# Patient Record
Sex: Female | Born: 1955 | Race: White | Hispanic: No | Marital: Married | Smoking: Never smoker
Health system: Southern US, Community
[De-identification: ages and names within clinical notes are randomized; demographics above are authoritative.]

## PROBLEM LIST (undated history)

## (undated) DIAGNOSIS — M1990 Unspecified osteoarthritis, unspecified site: Secondary | ICD-10-CM

---

## 1999-02-21 ENCOUNTER — Other Ambulatory Visit: Admission: RE | Admit: 1999-02-21 | Discharge: 1999-02-21 | Payer: Self-pay | Admitting: *Deleted

## 2001-06-28 ENCOUNTER — Other Ambulatory Visit: Admission: RE | Admit: 2001-06-28 | Discharge: 2001-06-28 | Payer: Self-pay | Admitting: *Deleted

## 2001-10-02 ENCOUNTER — Ambulatory Visit (HOSPITAL_COMMUNITY): Admission: RE | Admit: 2001-10-02 | Discharge: 2001-10-02 | Payer: Self-pay | Admitting: *Deleted

## 2001-10-02 ENCOUNTER — Encounter: Payer: Self-pay | Admitting: *Deleted

## 2001-10-08 ENCOUNTER — Encounter: Admission: RE | Admit: 2001-10-08 | Discharge: 2001-10-08 | Payer: Self-pay | Admitting: *Deleted

## 2001-10-08 ENCOUNTER — Encounter: Payer: Self-pay | Admitting: *Deleted

## 2002-08-21 ENCOUNTER — Other Ambulatory Visit: Admission: RE | Admit: 2002-08-21 | Discharge: 2002-08-21 | Payer: Self-pay | Admitting: Obstetrics and Gynecology

## 2004-02-08 ENCOUNTER — Ambulatory Visit (HOSPITAL_COMMUNITY): Admission: RE | Admit: 2004-02-08 | Discharge: 2004-02-08 | Payer: Self-pay | Admitting: Obstetrics & Gynecology

## 2004-02-24 ENCOUNTER — Other Ambulatory Visit: Admission: RE | Admit: 2004-02-24 | Discharge: 2004-02-24 | Payer: Self-pay | Admitting: Obstetrics & Gynecology

## 2005-06-26 ENCOUNTER — Ambulatory Visit (HOSPITAL_COMMUNITY): Admission: RE | Admit: 2005-06-26 | Discharge: 2005-06-26 | Payer: Self-pay | Admitting: Obstetrics & Gynecology

## 2006-07-24 ENCOUNTER — Ambulatory Visit (HOSPITAL_COMMUNITY): Admission: RE | Admit: 2006-07-24 | Discharge: 2006-07-24 | Payer: Self-pay | Admitting: Obstetrics & Gynecology

## 2008-03-19 ENCOUNTER — Ambulatory Visit (HOSPITAL_COMMUNITY): Admission: RE | Admit: 2008-03-19 | Discharge: 2008-03-19 | Payer: Self-pay | Admitting: Obstetrics & Gynecology

## 2008-04-01 ENCOUNTER — Encounter: Admission: RE | Admit: 2008-04-01 | Discharge: 2008-04-01 | Payer: Self-pay | Admitting: Obstetrics & Gynecology

## 2008-09-21 ENCOUNTER — Encounter: Admission: RE | Admit: 2008-09-21 | Discharge: 2008-09-21 | Payer: Self-pay | Admitting: Obstetrics & Gynecology

## 2009-08-02 ENCOUNTER — Encounter: Admission: RE | Admit: 2009-08-02 | Discharge: 2009-08-02 | Payer: Self-pay | Admitting: Obstetrics & Gynecology

## 2011-01-02 ENCOUNTER — Other Ambulatory Visit
Admission: RE | Admit: 2011-01-02 | Discharge: 2011-01-02 | Payer: Self-pay | Source: Home / Self Care | Admitting: Family Medicine

## 2011-01-04 ENCOUNTER — Encounter
Admission: RE | Admit: 2011-01-04 | Discharge: 2011-01-04 | Payer: Self-pay | Source: Home / Self Care | Attending: Family Medicine | Admitting: Family Medicine

## 2012-07-29 ENCOUNTER — Other Ambulatory Visit: Payer: Self-pay | Admitting: Obstetrics

## 2012-07-29 DIAGNOSIS — Z1231 Encounter for screening mammogram for malignant neoplasm of breast: Secondary | ICD-10-CM

## 2012-08-15 ENCOUNTER — Ambulatory Visit
Admission: RE | Admit: 2012-08-15 | Discharge: 2012-08-15 | Disposition: A | Discharge status: Home / Self Care | Payer: BC Managed Care – PPO | Source: Ambulatory Visit | Attending: Obstetrics | Admitting: Obstetrics

## 2012-08-15 DIAGNOSIS — Z1231 Encounter for screening mammogram for malignant neoplasm of breast: Secondary | ICD-10-CM

## 2012-08-20 ENCOUNTER — Other Ambulatory Visit: Payer: Self-pay | Admitting: Obstetrics

## 2012-08-20 DIAGNOSIS — R928 Other abnormal and inconclusive findings on diagnostic imaging of breast: Secondary | ICD-10-CM

## 2012-08-23 ENCOUNTER — Ambulatory Visit
Admission: RE | Admit: 2012-08-23 | Discharge: 2012-08-23 | Disposition: A | Discharge status: Home / Self Care | Payer: BC Managed Care – PPO | Source: Ambulatory Visit | Attending: Obstetrics | Admitting: Obstetrics

## 2012-08-23 DIAGNOSIS — R928 Other abnormal and inconclusive findings on diagnostic imaging of breast: Secondary | ICD-10-CM

## 2018-04-10 ENCOUNTER — Other Ambulatory Visit: Payer: Self-pay | Admitting: Family Medicine

## 2018-04-10 DIAGNOSIS — Z139 Encounter for screening, unspecified: Secondary | ICD-10-CM

## 2018-04-30 ENCOUNTER — Ambulatory Visit
Admission: RE | Admit: 2018-04-30 | Discharge: 2018-04-30 | Disposition: A | Discharge status: Home / Self Care | Payer: BC Managed Care – PPO | Source: Ambulatory Visit | Attending: Family Medicine | Admitting: Family Medicine

## 2018-04-30 DIAGNOSIS — Z139 Encounter for screening, unspecified: Secondary | ICD-10-CM

## 2018-05-01 ENCOUNTER — Other Ambulatory Visit: Payer: Self-pay | Admitting: Family Medicine

## 2018-05-01 DIAGNOSIS — R928 Other abnormal and inconclusive findings on diagnostic imaging of breast: Secondary | ICD-10-CM

## 2018-05-03 ENCOUNTER — Ambulatory Visit
Admission: RE | Admit: 2018-05-03 | Discharge: 2018-05-03 | Disposition: A | Discharge status: Home / Self Care | Payer: BC Managed Care – PPO | Source: Ambulatory Visit | Attending: Family Medicine | Admitting: Family Medicine

## 2018-05-03 DIAGNOSIS — R928 Other abnormal and inconclusive findings on diagnostic imaging of breast: Secondary | ICD-10-CM

## 2019-08-04 ENCOUNTER — Telehealth: Payer: Self-pay

## 2019-08-04 NOTE — Telephone Encounter (Signed)
NOTES ON FILE FROM DR Marda Stalker [phone] REFERRAL SENT TO SCHEDULING

## 2019-09-04 ENCOUNTER — Encounter: Payer: Self-pay | Admitting: Cardiology

## 2019-09-04 ENCOUNTER — Other Ambulatory Visit: Payer: Self-pay

## 2019-09-04 ENCOUNTER — Ambulatory Visit: Payer: BC Managed Care – PPO | Admitting: Cardiology

## 2019-09-04 VITALS — BP 130/78 | HR 78 | Temp 97.0°F | Ht 64.0 in | Wt 199.6 lb

## 2019-09-04 DIAGNOSIS — R002 Palpitations: Secondary | ICD-10-CM | POA: Diagnosis not present

## 2019-09-04 DIAGNOSIS — Z7189 Other specified counseling: Secondary | ICD-10-CM | POA: Diagnosis not present

## 2019-09-04 NOTE — Progress Notes (Signed)
Cardiology Office Note:    Date:  09/04/2019   ID:  Virginia Fischer, DOB [birth year], MRN [id number]  PCP:  Marda Stalker, PA-C  Cardiologist:  Buford Dresser, MD PhD  Referring MD: Marda Stalker, PA-C   CC: palpitations  History of Present Illness:    Virginia Fischer is a 63 y.o. female without significant PMH who is seen as a new consult at the request of Marda Stalker, PA-C for the evaluation and management of palpitations.  Tachycardia/palpitations: -Initial onset: first noticed >15-20 years ago, but increased in 06/2019. Was walking 4 mi/3x per week without issues. Felt like her heart was skipping a beat. Stopped, but then the next day in the afternoon it came back and was worse. Felt chills/shaking at the time, better when she lay down. Felt like hard beats and skips. Settled down, has improved. Has a lot of medical anxiety per her description. Retired a few years ago, has had a hard time dealing with that, misses work. During the time her symptoms occurred, daughter/fiancee and her son all moved back home for a time. Mother has afib,  -Frequency/Duration: currently improved. Had been 1-2x/year before, events in July of this year were worst ever. -Associated symptoms: no chest pain, shortness of breath, dizziness, or presyncopal symptoms at the time. -Aggravating/alleviating factors: no clear related factors. Has improved with staying hydrated, laying on back instead of side when she sleeps, doing meditation app. Thinks it might have helped. -Syncope/near syncope: none -Prior cardiac history: none -Prior ECG:  -Prior workup: none -Prior treatment: none -Possible medication interactions: no prescription meds -Caffeine: none -Alcohol: glass of wine with dinner not every night but most nights. -Tobacco: none -OTC supplements: vit D, PRN advil, otherwise none -Comorbidities: none -Exercise level: quit walking for a while back in July, but recently restarted.  Doing a few miles/day currently without issue. -Labs: TSH, kidney function/electrolytes, CBC reviewed. -Cardiac ROS: no chest pain, no shortness of breath, no PND, no orthopnea, no LE edema. -Family history: mother has afib. Mat gpa had pacemaker and mat gma had CHF, died of MI in her late 66s. Gena Fray had stroke late in life. Older sister with HTN, younger sister healthy.  History reviewed. No pertinent past medical history.  History reviewed. No pertinent surgical history.  Current Medications: No current outpatient medications on file prior to visit.   No current facility-administered medications on file prior to visit.      Allergies:   Patient has no known allergies.   Social History   Socioeconomic History  . Marital status: Married    Spouse name: Not on file  . Number of children: Not on file  . Years of education: Not on file  . Highest education level: Not on file  Occupational History  . Not on file  Social Needs  . Financial resource strain: Not on file  . Food insecurity    Worry: Not on file    Inability: Not on file  . Transportation needs    Medical: Not on file    Non-medical: Not on file  Tobacco Use  . Smoking status: Never Smoker  . Smokeless tobacco: Never Used  Substance and Sexual Activity  . Alcohol use: Not on file  . Drug use: Not on file  . Sexual activity: Not on file  Lifestyle  . Physical activity    Days per week: Not on file    Minutes per session: Not on file  . Stress: Not on file  Relationships  . Social Musician on phone: Not on file    Gets together: Not on file    Attends religious service: Not on file    Active member of club or organization: Not on file    Attends meetings of clubs or organizations: Not on file    Relationship status: Not on file  Other Topics Concern  . Not on file  Social History Narrative  . Not on file     Family History: mother has afib. Mat gpa had pacemaker and mat gma had CHF,  died of MI in her late 75s. Audelia Hives had stroke late in life. Older sister with HTN, younger sister healthy.  ROS:   Please see the history of present illness.  Additional pertinent ROS: Constitutional: Negative for chills, fever, night sweats, unintentional weight loss  HENT: Negative for ear pain and hearing loss.   Eyes: Negative for loss of vision and eye pain.  Respiratory: Negative for cough, sputum, wheezing.   Cardiovascular: See HPI. Gastrointestinal: Negative for abdominal pain, melena, and hematochezia.  Genitourinary: Negative for dysuria and hematuria.  Musculoskeletal: Negative for falls and myalgias.  Skin: Negative for itching and rash.  Neurological: Negative for focal weakness, focal sensory changes and loss of consciousness.  Endo/Heme/Allergies: Does not bruise/bleed easily.     EKGs/Labs/Other Studies Reviewed:    The following studies were reviewed today: No prior cardiac studies  EKG:  EKG is personally reviewed.  The ekg ordered today demonstrates NSR  Recent Labs: No results found for requested labs within last 8760 hours.  Recent Lipid Panel No results found for: CHOL, TRIG, HDL, CHOLHDL, VLDL, LDLCALC, LDLDIRECT  Physical Exam:    VS:  BP 130/78   Pulse 78   Temp (!) 97 F (36.1 C)   Ht 5\' 4"  (1.626 m)   Wt 199 lb 9.6 oz (90.5 kg)   SpO2 99%   BMI 34.26 kg/m     Wt Readings from Last 3 Encounters:  09/04/19 199 lb 9.6 oz (90.5 kg)    GEN: Well nourished, well developed in no acute distress HEENT: Normal, moist mucous membranes NECK: No JVD CARDIAC: regular rhythm, normal S1 and S2, no murmurs, rubs, gallops.  VASCULAR: Radial and DP pulses 2+ bilaterally. No carotid bruits RESPIRATORY:  Clear to auscultation without rales, wheezing or rhonchi  ABDOMEN: Soft, non-tender, non-distended MUSCULOSKELETAL:  Ambulates independently SKIN: Warm and dry, no edema NEUROLOGIC:  Alert and oriented x 3. No focal neuro deficits noted. PSYCHIATRIC:   Normal affect    ASSESSMENT:    1. Palpitation   2. Cardiac risk counseling   3. Counseling on health promotion and disease prevention    PLAN:    Palpitations: infrequent, had some back to back several months ago, none since. Had gone years between episodes in the past. Discussed that we have monitors that can be worn for up to 30 days (though most comfortable if 2 weeks or less), but for infrequent events, these may not catch episodes. They can rule out silent arrhythmia, but this is not what has been bothersome to her. Also discussed alternatives, such as Building surveyor. -given the infrequency of her symptoms, declines monitor at this time -will consider [employer] -if symptoms recur with increased frequency, she will contact me, and we will order a monitor  Cardiac risk counseling and prevention recommendations: -recommend heart healthy/Mediterranean diet, with whole grains, fruits, vegetable, fish, lean meats, nuts, and olive oil. Limit salt. -recommend moderate  walking, 3-5 times/week for 30-50 minutes each session. Aim for at least 150 minutes.week. Goal should be pace of 3 miles/hours, or walking 1.5 miles in 30 minutes -recommend avoidance of tobacco products. Avoid excess alcohol. -Additional risk factor control:  -Diabetes: A1c from 2017 5.6, no history of diabeted  -Lipids: from 2019: LDL 122, HDL 59, TG 76  -Blood pressure control: no history of hypertension, at goal today  -Weight: BMI 34, would benefit from healthy weight -ASCVD risk score: The ASCVD Risk score Denman George(Goff DC Jr., et al., 2013) failed to calculate for the following reasons:   The systolic blood pressure is missing   Cannot find a previous HDL lab   Cannot find a previous total cholesterol lab   The smoking status is invalid    Plan for follow up:  Medication Adjustments/Labs and Tests Ordered: Current medicines are reviewed at length with the patient today.  Concerns regarding medicines are  outlined above.  Orders Placed This Encounter  Procedures  . EKG 12-Lead   No orders of the defined types were placed in this encounter.   Patient Instructions  Medication Instructions:  Your Physician recommend you continue on your current medication as directed.    If you need a refill on your cardiac medications before your next appointment, please call your pharmacy.   Lab work: None  Testing/Procedures: None  Follow-Up: At [facility], you and your health needs are our priority.  As part of our continuing mission to provide you with exceptional heart care, we have created designated Provider Care Teams.  These Care Teams include your primary Cardiologist (physician) and Advanced Practice Providers (APPs -  Physician Assistants and Nurse Practitioners) who all work together to provide you with the care you need, when you need it. You will need a follow up appointment as needed.  Please call our office 2 months in advance to schedule this appointment.  You may see Dr. Cristal Deerhristopher or one of the following Advanced Practice Providers on your designated Care Team:   Theodore DemarkRhonda Barrett, PA-C . Joni ReiningKathryn Lawrence, DNP, ANP  [facility]       Signed, Jodelle RedBridgette Athan Casalino, MD PhD 09/04/2019 9:52 PM    [facility]

## 2019-09-04 NOTE — Patient Instructions (Signed)
Medication Instructions:  Your Physician recommend you continue on your current medication as directed.    If you need a refill on your cardiac medications before your next appointment, please call your pharmacy.   Lab work: None  Testing/Procedures: None  Follow-Up: At [facility], you and your health needs are our priority.  As part of our continuing mission to provide you with exceptional heart care, we have created designated Provider Care Teams.  These Care Teams include your primary Cardiologist (physician) and Advanced Practice Providers (APPs -  Physician Assistants and Nurse Practitioners) who all work together to provide you with the care you need, when you need it. You will need a follow up appointment as needed.  Please call our office 2 months in advance to schedule this appointment.  You may see Dr. Harrell Gave or one of the following Advanced Practice Providers on your designated Care Team:   Rosaria Ferries, PA-C . Jory Sims, DNP, ANP  [facility]ton

## 2019-09-08 ENCOUNTER — Encounter: Payer: Self-pay | Admitting: Cardiology

## 2019-09-29 ENCOUNTER — Other Ambulatory Visit: Payer: Self-pay | Admitting: Family Medicine

## 2019-09-29 DIAGNOSIS — Z1231 Encounter for screening mammogram for malignant neoplasm of breast: Secondary | ICD-10-CM

## 2019-10-03 ENCOUNTER — Ambulatory Visit
Admission: RE | Admit: 2019-10-03 | Discharge: 2019-10-03 | Disposition: A | Discharge status: Home / Self Care | Payer: BC Managed Care – PPO | Source: Ambulatory Visit | Attending: Family Medicine | Admitting: Family Medicine

## 2019-10-03 ENCOUNTER — Other Ambulatory Visit: Payer: Self-pay

## 2019-10-03 DIAGNOSIS — Z1231 Encounter for screening mammogram for malignant neoplasm of breast: Secondary | ICD-10-CM

## 2019-10-07 ENCOUNTER — Other Ambulatory Visit: Payer: Self-pay | Admitting: Family Medicine

## 2019-10-07 DIAGNOSIS — R928 Other abnormal and inconclusive findings on diagnostic imaging of breast: Secondary | ICD-10-CM

## 2019-10-08 ENCOUNTER — Ambulatory Visit
Admission: RE | Admit: 2019-10-08 | Discharge: 2019-10-08 | Disposition: A | Discharge status: Home / Self Care | Payer: BC Managed Care – PPO | Source: Ambulatory Visit | Attending: Family Medicine | Admitting: Family Medicine

## 2019-10-08 ENCOUNTER — Other Ambulatory Visit: Payer: Self-pay

## 2019-10-08 DIAGNOSIS — R928 Other abnormal and inconclusive findings on diagnostic imaging of breast: Secondary | ICD-10-CM

## 2019-10-22 ENCOUNTER — Other Ambulatory Visit (HOSPITAL_COMMUNITY)
Admission: RE | Admit: 2019-10-22 | Discharge: 2019-10-22 | Disposition: A | Discharge status: Home / Self Care | Payer: BC Managed Care – PPO | Source: Ambulatory Visit | Attending: Family Medicine | Admitting: Family Medicine

## 2019-10-22 ENCOUNTER — Other Ambulatory Visit: Payer: Self-pay | Admitting: Family Medicine

## 2019-10-22 DIAGNOSIS — Z124 Encounter for screening for malignant neoplasm of cervix: Secondary | ICD-10-CM | POA: Diagnosis present

## 2019-10-27 LAB — CYTOLOGY - PAP
Comment: NEGATIVE
Diagnosis: NEGATIVE
High risk HPV: NEGATIVE

## 2020-01-26 IMAGING — MG DIGITAL DIAGNOSTIC UNILAT LEFT W/ CAD
3 series · 3 of 3 positions shown · non-contrast
Comparison: Previous exam(s).

CLINICAL DATA: Patient recalled from screening for left breast
calcifications.

EXAM:
DIGITAL DIAGNOSTIC LEFT MAMMOGRAM WITH CAD

[L ML (1 of 2)]
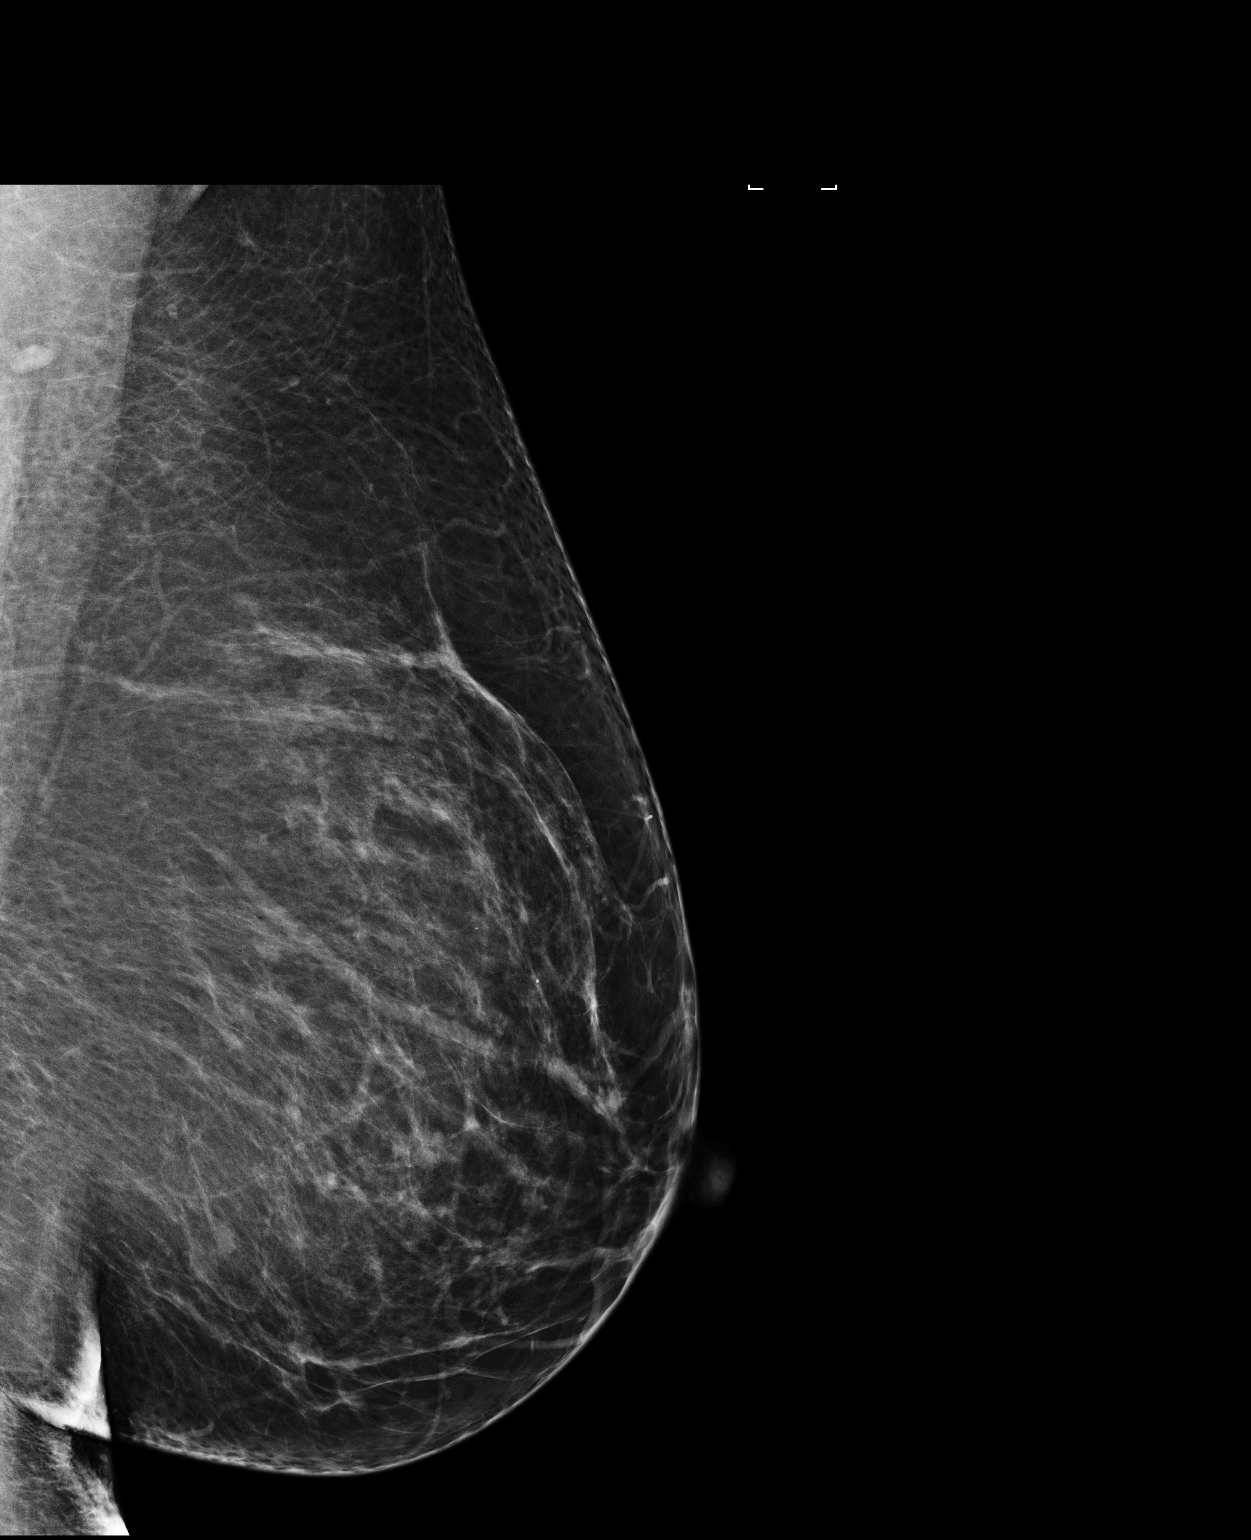

[L CC]
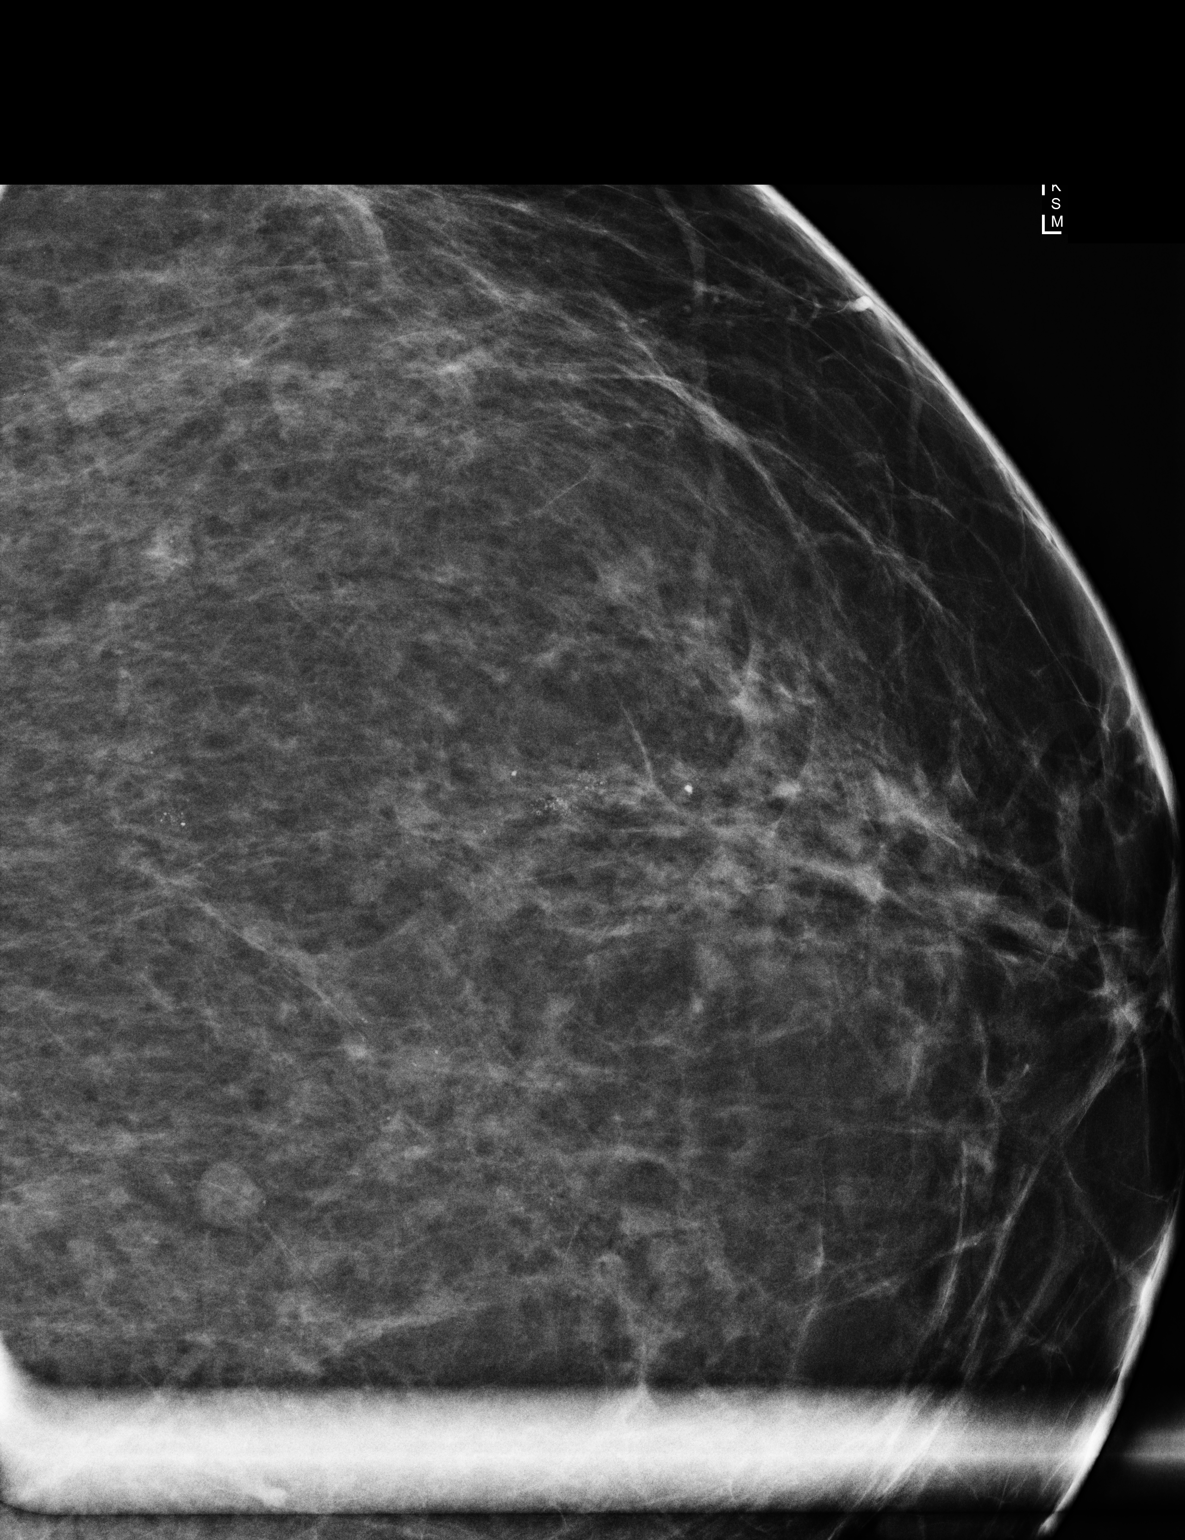

[L ML (2 of 2)]
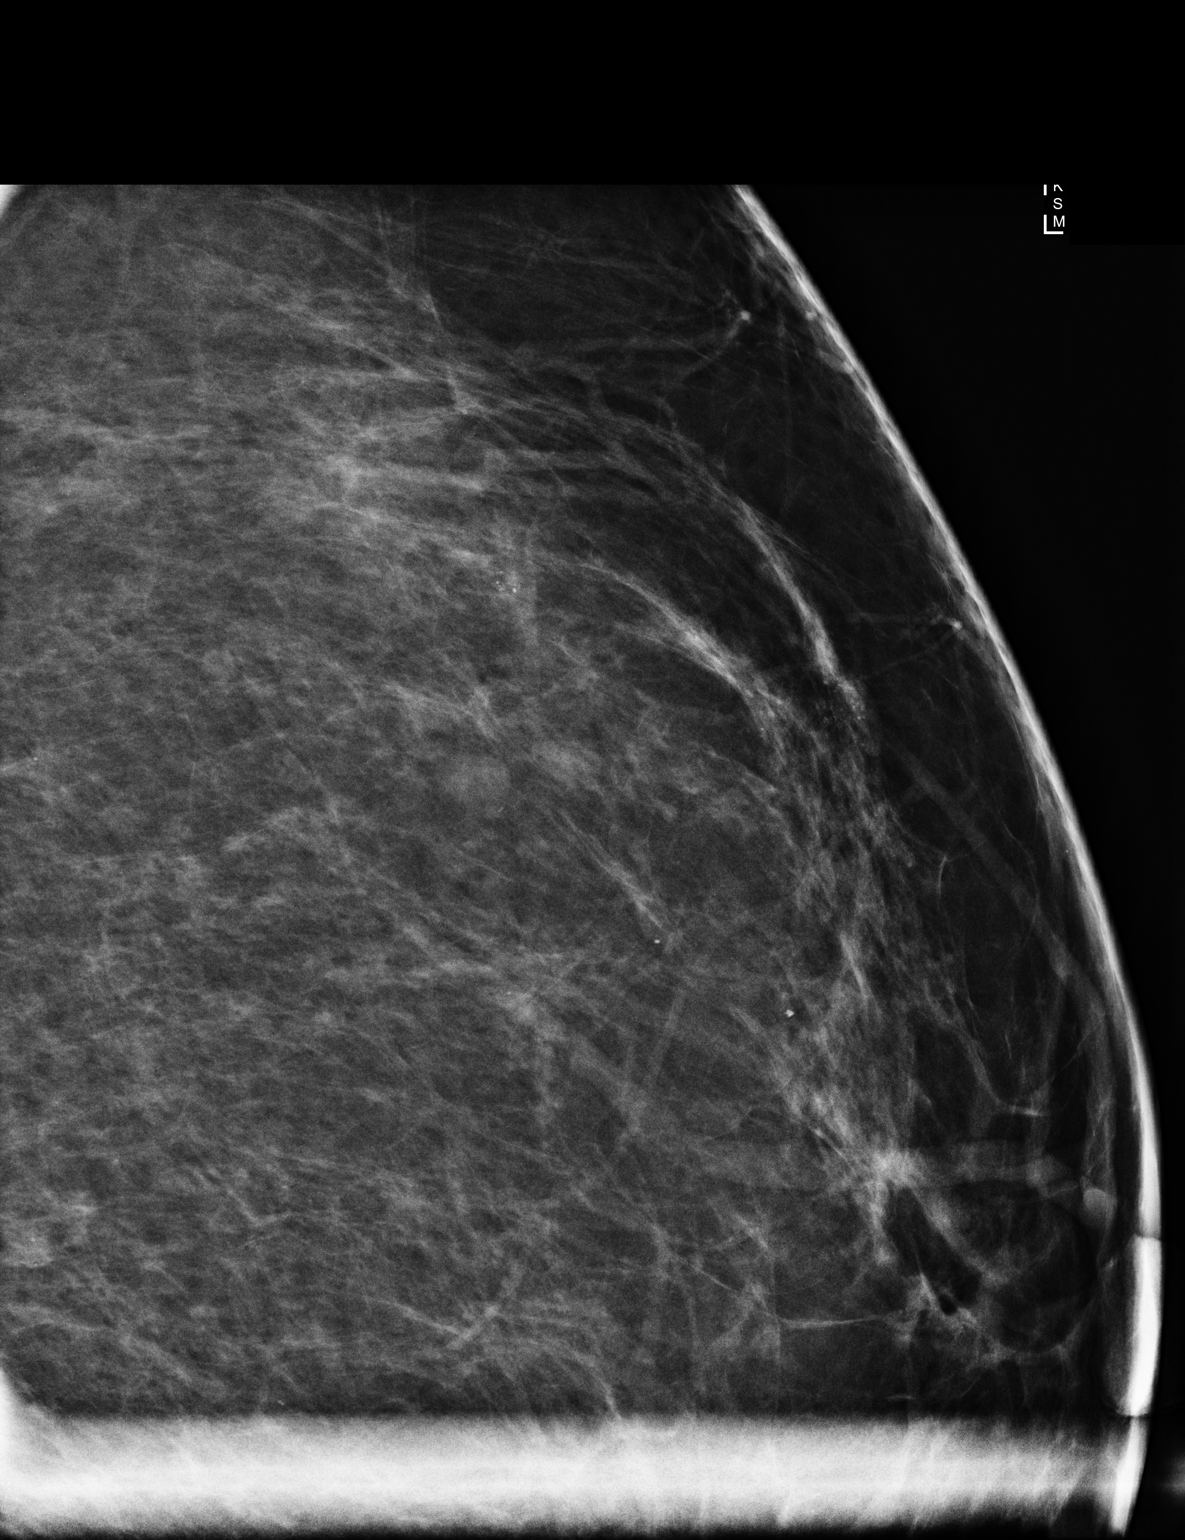

[3 of 3 positions shown; findings below may reference images not displayed]

ACR Breast Density Category c: The breast tissue is heterogeneously
dense, which may obscure small masses.
FINDINGS: Magnification CC and true lateral views of the left breast were
obtained. Loosely grouped round and punctate calcifications are
demonstrated within the superior aspect of the left breast. Overall
these appear stable compared with multiple prior exams.

Mammographic images were processed with CAD.
IMPRESSION: Benign left breast calcifications.

RECOMMENDATION:
Screening mammogram in one year.(Code:[id number])

I have discussed the findings and recommendations with the patient.
If applicable, a reminder letter will be sent to the patient
regarding the next appointment.

BI-RADS CATEGORY  2: Benign.

## 2020-03-05 ENCOUNTER — Ambulatory Visit: Payer: BC Managed Care – PPO | Attending: Internal Medicine

## 2020-03-05 DIAGNOSIS — Z23 Encounter for immunization: Secondary | ICD-10-CM

## 2020-03-05 NOTE — Progress Notes (Signed)
   Covid-19 Vaccination Clinic  Name:  Virginia Fischer    MRN: [id number] DOB: 02-02-1956  03/05/2020  [name] was observed post Covid-19 immunization for 15 minutes without incident. She was provided with Vaccine Information Sheet and instruction to access the V-Safe system.   [name] was instructed to call 911 with any severe reactions post vaccine: Marland Kitchen Difficulty breathing  . Swelling of face and throat  . A fast heartbeat  . A bad rash all over body  . Dizziness and weakness   Immunizations Administered    Name Date Dose VIS Date Route   Pfizer COVID-19 Vaccine 03/05/2020  9:10 AM 0.3 mL 11/28/2019 Intramuscular   Manufacturer: [employer], [employer]   Lot: [id number]   NDC: [address]-0

## 2020-03-30 ENCOUNTER — Ambulatory Visit: Payer: BC Managed Care – PPO | Attending: Internal Medicine

## 2020-03-30 DIAGNOSIS — Z23 Encounter for immunization: Secondary | ICD-10-CM

## 2020-03-30 NOTE — Progress Notes (Signed)
   Covid-19 Vaccination Clinic  Name:  Virginia Fischer    MRN: [id number] DOB: 05-07-1956  03/30/2020  Virginia Fischer was observed post Covid-19 immunization for 15 minutes without incident. She was provided with Vaccine Information Sheet and instruction to access the V-Safe system.   Virginia Fischer was instructed to call 911 with any severe reactions post vaccine: Marland Kitchen Difficulty breathing  . Swelling of face and throat  . A fast heartbeat  . A bad rash all over body  . Dizziness and weakness   Immunizations Administered    Name Date Dose VIS Date Route   Pfizer COVID-19 Vaccine 03/30/2020 10:03 AM 0.3 mL 11/28/2019 Intramuscular   Manufacturer: [employer], [employer]   Lot: [id number]   NDC: [address]-2

## 2020-09-21 ENCOUNTER — Other Ambulatory Visit: Payer: Self-pay | Admitting: Family Medicine

## 2020-09-21 DIAGNOSIS — Z1231 Encounter for screening mammogram for malignant neoplasm of breast: Secondary | ICD-10-CM

## 2020-10-08 ENCOUNTER — Ambulatory Visit
Admission: RE | Admit: 2020-10-08 | Discharge: 2020-10-08 | Disposition: A | Discharge status: Home / Self Care | Payer: BC Managed Care – PPO | Source: Ambulatory Visit | Attending: Family Medicine | Admitting: Family Medicine

## 2020-10-08 ENCOUNTER — Other Ambulatory Visit: Payer: Self-pay

## 2020-10-08 DIAGNOSIS — Z1231 Encounter for screening mammogram for malignant neoplasm of breast: Secondary | ICD-10-CM

## 2020-10-13 ENCOUNTER — Other Ambulatory Visit: Payer: Self-pay | Admitting: Family Medicine

## 2020-10-13 DIAGNOSIS — R928 Other abnormal and inconclusive findings on diagnostic imaging of breast: Secondary | ICD-10-CM

## 2020-10-29 ENCOUNTER — Ambulatory Visit
Admission: RE | Admit: 2020-10-29 | Discharge: 2020-10-29 | Disposition: A | Discharge status: Home / Self Care | Payer: BC Managed Care – PPO | Source: Ambulatory Visit | Attending: Family Medicine | Admitting: Family Medicine

## 2020-10-29 ENCOUNTER — Other Ambulatory Visit: Payer: Self-pay

## 2020-10-29 DIAGNOSIS — R928 Other abnormal and inconclusive findings on diagnostic imaging of breast: Secondary | ICD-10-CM

## 2021-06-17 ENCOUNTER — Other Ambulatory Visit: Payer: Self-pay | Admitting: Home Modifications

## 2021-06-17 ENCOUNTER — Ambulatory Visit
Admission: RE | Admit: 2021-06-17 | Discharge: 2021-06-17 | Disposition: A | Discharge status: Home / Self Care | Payer: BC Managed Care – PPO | Source: Ambulatory Visit | Attending: Home Modifications | Admitting: Home Modifications

## 2021-06-17 DIAGNOSIS — R52 Pain, unspecified: Secondary | ICD-10-CM

## 2021-07-11 ENCOUNTER — Encounter: Payer: Self-pay | Admitting: Orthopaedic Surgery

## 2021-07-11 ENCOUNTER — Other Ambulatory Visit: Payer: Self-pay

## 2021-07-11 ENCOUNTER — Ambulatory Visit: Payer: BC Managed Care – PPO | Admitting: Orthopaedic Surgery

## 2021-07-11 DIAGNOSIS — G8929 Other chronic pain: Secondary | ICD-10-CM | POA: Diagnosis not present

## 2021-07-11 DIAGNOSIS — M25561 Pain in right knee: Secondary | ICD-10-CM | POA: Diagnosis not present

## 2021-07-11 MED ORDER — LIDOCAINE HCL 1 % IJ SOLN
3.0000 mL | INTRAMUSCULAR | Status: AC | PRN
  Administered 2021-07-11: 3 mL

## 2021-07-11 MED ORDER — METHYLPREDNISOLONE ACETATE 40 MG/ML IJ SUSP
40.0000 mg | INTRAMUSCULAR | Status: AC | PRN
  Administered 2021-07-11: 40 mg via INTRA_ARTICULAR

## 2021-07-11 NOTE — Progress Notes (Signed)
Office Visit Note   Patient: Virginia Fischer           Date of Birth: 1956/08/27           MRN: [id number] Visit Date: 07/11/2021              Requested by: Jarrett Soho, PA-C [address] [place],  [place] [address] PCP: Jarrett Soho, PA-C   Assessment & Plan: Visit Diagnoses:  1. Chronic pain of right knee     Plan: I talked to her about her knee in general and in detail about some arthritic changes in her knee.  Certainly she could have some meniscal issues as well given the medial joint line tenderness.  I recommended a steroid injection today and I would like to set her up for outpatient physical therapy to strengthen the muscles around the knee as well as work on her IT band in her right hip.  She agrees with this treatment plan.  I explained the risks and benefits of steroid injections and she tolerated well.  Later on she may be a candidate for hyaluronic acid but working to try this first in terms of the steroid injection, continued Advil as needed and outpatient physical therapy for her right knee.  I will see her back in 4 weeks to see how she is doing overall.  All questions and concerns were answered and addressed.  Follow-Up Instructions: Return in about 4 weeks (around 08/08/2021).   Orders:  Orders Placed This Encounter  Procedures   Large Joint Inj   No orders of the defined types were placed in this encounter.     Procedures: Large Joint Inj: R knee on 07/11/2021 5:20 PM Indications: diagnostic evaluation and pain Details: 22 G 1.5 in needle, superolateral approach  Arthrogram: No  Medications: 3 mL lidocaine 1 %; 40 mg methylPREDNISolone acetate 40 MG/ML Outcome: tolerated well, no immediate complications Procedure, treatment alternatives, risks and benefits explained, specific risks discussed. Consent was given by the patient. Immediately prior to procedure a time out was called to verify the correct patient, procedure, equipment, support  staff and site/side marked as required. Patient was prepped and draped in the usual sterile fashion.      Clinical Data: No additional findings.   Subjective: Chief Complaint  Patient presents with   Right Knee - Pain  The patient is a very pleasant and active 65 year old female who comes in for evaluation treatment of chronic right knee pain.  She says it hurts more in the back of her knee but some on the medial aspect.  There is been no known injury.  She is to work out on a treadmill more and do a lot of walking.  She has done that less with time and has been developing some swelling in the back of her knee.  She says it hurts to walk or fully flex her knee back.  Taking Advil has helped.  She is never had an injection in her knee and never had surgery on that right knee.  Her left knee has no issues.  She is also been dealing with some right hip pain.  They are x-rays on the canopy system of her pelvis and hip as well as right knee.  HPI  Review of Systems   Objective: Vital Signs: There were no vitals taken for this visit.  Physical Exam She is alert and orient x3 and in no acute distress Ortho Exam Examination of both hips show they  move smoothly and fluidly.  There is a little bit of pain over the right trochanteric area and IT band on the right side.  Her right knee shows varus malalignment that is only slight but easily correctable.  Both knees have some slight patellofemoral crepitation.  The right knee does have medial joint line tenderness and pain past 90 degrees of flexion.  The right knee is ligamentously stable. Specialty Comments:  No specialty comments available.  Imaging: No results found. 2 views on the right knee on the canopy system independently reviewed show mild to moderate arthritic changes mainly involving the patellofemoral joint.  There is slight varus malalignment and small osteophytes in all 3 compartments.  X-rays of the pelvis and right hip are  negative with a normal-appearing hip joint.  PMFS History: There are no problems to display for this patient.  History reviewed. No pertinent past medical history.  Family History  Problem Relation Age of Onset   Hypertension Mother    Arrhythmia Mother    Hypertension Sister    Heart failure Maternal Grandmother    Cancer Maternal Grandfather    Cancer Paternal Grandmother    Stroke Paternal Grandfather    Breast cancer Neg Hx     History reviewed. No pertinent surgical history. Social History   Occupational History   Not on file  Tobacco Use   Smoking status: Never   Smokeless tobacco: Never  Substance and Sexual Activity   Alcohol use: Not on file   Drug use: Not on file   Sexual activity: Not on file

## 2021-07-13 ENCOUNTER — Other Ambulatory Visit: Payer: Self-pay

## 2021-07-13 DIAGNOSIS — G8929 Other chronic pain: Secondary | ICD-10-CM

## 2021-07-21 ENCOUNTER — Other Ambulatory Visit: Payer: Self-pay

## 2021-07-21 ENCOUNTER — Ambulatory Visit: Payer: BC Managed Care – PPO | Admitting: Physical Therapy

## 2021-07-21 DIAGNOSIS — G8929 Other chronic pain: Secondary | ICD-10-CM | POA: Diagnosis not present

## 2021-07-21 DIAGNOSIS — M25561 Pain in right knee: Secondary | ICD-10-CM | POA: Diagnosis not present

## 2021-07-21 DIAGNOSIS — R262 Difficulty in walking, not elsewhere classified: Secondary | ICD-10-CM | POA: Diagnosis not present

## 2021-07-21 DIAGNOSIS — M25551 Pain in right hip: Secondary | ICD-10-CM

## 2021-07-21 NOTE — Patient Instructions (Signed)
Access Code: [email or web address] URL: [email or web address] Date: 07/21/2021 Prepared by: Ivery Quale  Exercises Supine Quadriceps Stretch with Strap on Table - 2 x daily - 6 x weekly - 2-3 reps - 30 hold Supine ITB Stretch with Strap - 2 x daily - 6 x weekly - 2-3 reps - 30 hold Figure 4 Bridge - 2 x daily - 6 x weekly - 1-2 sets - 10 reps Standing Hip Flexion with Resistance Loop - 2 x daily - 6 x weekly - 2 sets - 15-20 reps Standing Hip Abduction with Resistance at Ankles and Counter Support - 2 x daily - 6 x weekly - 2 sets - 10 reps Standing Hip Extension Kicks - 2 x daily - 6 x weekly - 2 sets - 10 reps Sit to Stand Without Arm Support - 2 x daily - 6 x weekly - 1-2 sets - 10 reps

## 2021-07-22 NOTE — Therapy (Addendum)
[facility] [address] [place], [place], [address] Phone: [phone]   Fax:  [phone]  Physical Therapy Evaluation /Discharge  Patient Details  Name: Virginia Fischer MRN: [id number] Date of Birth: 18-Dec-1956 Referring Provider (PT): Mcarthur Rossetti, MD   Encounter Date: 07/21/2021   PT End of Session - 07/22/21 0800     Visit Number 1    Number of Visits 4    Date for PT Re-Evaluation 09/16/21    PT Start Time 1600    PT Stop Time 1650    PT Time Calculation (min) 50 min    Activity Tolerance Patient tolerated treatment well    Behavior During Therapy [facility] for tasks assessed/performed             No past medical history on file.  No past surgical history on file.  There were no vitals filed for this visit.    Subjective Assessment - 07/21/21 1602     Subjective Chronic pain of right knee for last 2 years.without known MOI  she had injection Rt knee 07/11/21 which did help a lot. 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    Diagnostic tests Imaging " XR showing moderate arthritic changes mainly involving the patellofemoral joint.  There is slight varus malalignment and small osteophytes in all 3 compartments.  X-rays of the pelvis and right hip are negative with a normal-appearing hip joint"    Currently in Pain? Yes    Pain Score --   no pain currently because had the week off from volunteering, it can get up to 7/10   Pain Location Other (Comment)   Rt hip and knee   Pain Orientation Right    Pain Descriptors / Indicators Aching    Pain Type Chronic pain    Pain Radiating Towards maybe N/T down Rt thigh.    Aggravating Factors  prolonged standing, walking, stairs    Pain Relieving Factors laying down on back, ice, rest, injection                [facility] PT Assessment - 07/22/21 0001       Assessment   Medical Diagnosis  M25.561,G89.29 (ICD-10-CM) - Chronic pain of right knee    Referring Provider (PT) Mcarthur Rossetti, MD    Onset Date/Surgical Date --   2 year onset of pai   Next MD Visit 08/08/21      Balance Screen   Has the patient fallen in the past 6 months No    Has the patient had a decrease in activity level because of a fear of falling?  No    Is the patient reluctant to leave their home because of a fear of falling?  No      Home Ecologist residence      Prior Function   Level of Independence Independent      Cognition   Overall Cognitive Status Within Functional Limits for tasks assessed      Posture/Postural Control   Posture Comments Lt lateral trunk shift?      ROM / Strength   AROM / PROM / Strength AROM;Strength      AROM   Overall AROM Comments WFL for hip, knee, lumbarr      Strength   Overall Strength Comments WFL gross LE strength bilat      Flexibility  Soft Tissue Assessment /Muscle Length --   mildly tight hamstrings and hip flexors     Palpation   Palpation comment denies TTP      Special Tests   Other special tests negative FABERS test, negative lumbar test      Ambulation/Gait   Gait Comments WFL                        Objective measurements completed on examination: See above findings.               PT Education - 07/22/21 0800     Education Details HEP,POC    Person(s) Educated Patient    Methods Explanation;Demonstration;Verbal cues;Handout    Comprehension Verbalized understanding                         Plan - 07/22/21 0801     Clinical Impression Statement She presents with chronic Rt hip and knee pain that has improved greatly after injection. 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 We discussed graded activity progression and gentle return to walking program. She  has good overall strength and ROM and special testing was negative today. At this point I provided her with HEP and feel like she will not need much PT and she also has high co pay. She will trial HEP, follow back up with MD at the end of the month and then return to PT if needed.    Examination-Activity Limitations Stairs;Stand    Examination-Participation Restrictions Occupation    Stability/Clinical Decision Making Stable/Uncomplicated    Clinical Decision Making Low    Rehab Potential Good    PT Frequency 1x / week    PT Duration 8 weeks    PT Treatment/Interventions ADLs/Self Care Home Management;Cryotherapy;Electrical Stimulation;Iontophoresis 20m/ml Dexamethasone;Ultrasound;Gait training;Therapeutic activities;Therapeutic exercise;Neuromuscular re-education;Manual techniques;Passive range of motion;Dry needling;Joint Manipulations;Vasopneumatic Device;Taping    PT Next Visit Plan establish long term goals if she returns, if not close out after POC date is up    PT Home Exercise Plan Access Code: [email or web address]            Patient will benefit from skilled therapeutic intervention in order to improve the following deficits and impairments:  Decreased activity tolerance, Difficulty walking, Postural dysfunction  Visit Diagnosis: Chronic pain of right knee  Pain in right hip  Difficulty in walking, not elsewhere classified     Problem List There are no problems to display for this patient.   BMarrianne MoodNelson,PT,DPT 07/22/2021, 8:07 AM  PHYSICAL THERAPY DISCHARGE SUMMARY  Visits from Start of Care: 1  Current functional level related to goals / functional outcomes: See note   Remaining deficits: See note   Education / Equipment: HEP   Patient agrees to discharge. Patient goals were not met. Patient is being discharged due to not returning since the last visit. MScot Jun PT, DPT, OCS, ATC 09/28/21  9:13 AM     [facility] [address] [place] [address]Phone: [phone]  Fax:  [phone] Name: CTHAYER INABINETMRN: [id number] of Birth: 110-31-1957

## 2021-08-08 ENCOUNTER — Ambulatory Visit: Payer: BC Managed Care – PPO | Admitting: Orthopaedic Surgery

## 2021-08-08 ENCOUNTER — Other Ambulatory Visit: Payer: Self-pay

## 2021-08-08 DIAGNOSIS — M25561 Pain in right knee: Secondary | ICD-10-CM

## 2021-08-08 DIAGNOSIS — G8929 Other chronic pain: Secondary | ICD-10-CM | POA: Diagnosis not present

## 2021-08-08 NOTE — Progress Notes (Signed)
The patient is a 65 year old well-known to me.  She has mild to moderate arthritis in her right knee and we placed a steroid injection on July 25 and that knee for the first time she has had 1.  She said that is worked great as well as going to physical therapy that is also helped work on Dance movement psychotherapist.  She is dealing with right groin pain and she does not feel like it is a hip issue.  I have actually x-rayed her right hip and pelvis and her x-rays are normal of the right hip joint itself.  On exam I can easily put her right hip through internal and external rotation and her pain seems to be just above the hip joint more in the groin area.  She is very tender in this area.  She does not have a GYN physician because she has her primary care physician take care of those issues.  She is concerned about the pain that she still gets in the groin area and it does not appear to be mechanical in nature from my standpoint.  At this point I would like to send her to [facility] surgery for evaluation of her right groin area to help determine whether or not this is a hernia or needs further work-up.  We will work on making that referral.  As far as her knee goes, she will continue quad strengthening exercises and if things worsen in any way she can always get repeat injections.  All questions and concerns were answered and addressed.

## 2021-08-09 ENCOUNTER — Other Ambulatory Visit: Payer: Self-pay

## 2021-08-09 DIAGNOSIS — R1031 Right lower quadrant pain: Secondary | ICD-10-CM

## 2021-08-29 ENCOUNTER — Telehealth: Payer: Self-pay | Admitting: Orthopaedic Surgery

## 2021-08-29 ENCOUNTER — Telehealth: Payer: Self-pay | Admitting: Cardiology

## 2021-08-29 DIAGNOSIS — R002 Palpitations: Secondary | ICD-10-CM

## 2021-08-29 NOTE — Telephone Encounter (Signed)
The referral was put in back in august. Can you please check on this for me?

## 2021-08-29 NOTE — Telephone Encounter (Signed)
Pt called wanting to check on a referral Dr. Magnus Ivan was supposed to send in for her. Pt would like a CB to discuss the hold up please.   [phone]

## 2021-08-29 NOTE — Telephone Encounter (Signed)
Patient c/o Palpitations:  High priority if patient c/o lightheadedness, shortness of breath, or chest pain  How long have you had palpitations/irregular HR/ Afib? Are you having the symptoms now? A month ago, no Are you currently experiencing lightheadedness, SOB or CP? no  Do you have a history of afib (atrial fibrillation) or irregular heart rhythm? yes  Have you checked your BP or HR? (document readings if available): HR usually 80's   Are you experiencing any other symptoms? During palpitations states she feels hot and cold, then exhausted   Patient states she has started to get palpitations again. She says it started about a month ago, but is not every day. She says she will be fine for a couple days then she will get palpitations and then she will be fine a couple more days. She says she has not had symptoms this morning, but yesterday they were bad. She says it feels like her heart skips around. She says this time if feels different.

## 2021-08-29 NOTE — Telephone Encounter (Signed)
Spoke with the patient who states that her palpitations have returned. She states that they have been occurring intermittently for about a month now. She states that they feel the same as previously. She was last seen in 08/2019 for palpitations and she states they feel the same as they did at that time. She denies any SOB or dizziness. States that she can feel heart skipping and jumping around. Heart rate has been 75-80. She states that she usually feels really hot or really cold when they begin and then she becomes fatigued. She was previously not interested in a heart monitor but now she is concerned and would like to know what is going on.

## 2021-08-29 NOTE — Telephone Encounter (Signed)
Patient has appointment 11/9 with Dr Cristal Deer, will forward for review

## 2021-08-30 NOTE — Telephone Encounter (Signed)
I called and sw receptionist Misty Stanley there and she stated it normally takes 2-3 for referral to be processed then contact pt, she transferred me to referrals and l left message to return myc all on status to see where we stand.

## 2021-09-01 NOTE — Telephone Encounter (Signed)
Patient is following up. She states she has been waiting for a call back to discuss Dr. Di Kindle recommendation.

## 2021-09-02 ENCOUNTER — Ambulatory Visit (INDEPENDENT_AMBULATORY_CARE_PROVIDER_SITE_OTHER): Payer: BC Managed Care – PPO

## 2021-09-02 DIAGNOSIS — R002 Palpitations: Secondary | ICD-10-CM

## 2021-09-02 NOTE — Progress Notes (Unsigned)
Enrolled patient for a 14 day Zio XT  monitor to be mailed to patients home  °

## 2021-09-02 NOTE — Telephone Encounter (Signed)
If there are red flags, like loss of consciousness, then I would present urgently to ER for evaluation. She can look into a device like a KardiaMobile to try to catch these events. If they are only happening sporadically, it is unlikely we will catch them on a monitor. If they become more of an every day event, please have her contact us and we can send a monitor at that time. If she can get tracings of some of these events, we can review at her appt.

## 2021-09-02 NOTE — Telephone Encounter (Signed)
Pt made aware of MD's recommendations and state symptoms are happening daily.  2 week monitor ordered Pt verbalize understanding.

## 2021-09-02 NOTE — Telephone Encounter (Signed)
Thank you :)

## 2021-09-06 DIAGNOSIS — R002 Palpitations: Secondary | ICD-10-CM | POA: Diagnosis not present

## 2021-10-26 ENCOUNTER — Encounter (HOSPITAL_BASED_OUTPATIENT_CLINIC_OR_DEPARTMENT_OTHER): Payer: Self-pay | Admitting: Cardiology

## 2021-10-26 ENCOUNTER — Other Ambulatory Visit: Payer: Self-pay

## 2021-10-26 ENCOUNTER — Ambulatory Visit (HOSPITAL_BASED_OUTPATIENT_CLINIC_OR_DEPARTMENT_OTHER): Payer: BC Managed Care – PPO | Admitting: Cardiology

## 2021-10-26 VITALS — BP 132/80 | HR 86 | Ht 64.0 in | Wt 203.9 lb

## 2021-10-26 DIAGNOSIS — Z7189 Other specified counseling: Secondary | ICD-10-CM | POA: Diagnosis not present

## 2021-10-26 DIAGNOSIS — Z712 Person consulting for explanation of examination or test findings: Secondary | ICD-10-CM | POA: Diagnosis not present

## 2021-10-26 DIAGNOSIS — R002 Palpitations: Secondary | ICD-10-CM | POA: Diagnosis not present

## 2021-10-26 NOTE — Progress Notes (Signed)
Cardiology Office Note:    Date:  10/26/2021   ID:  Sherri Rad, DOB 1956/10/23, MRN [id number]  PCP:  Marda Stalker, PA-C  Cardiologist:  Buford Dresser, MD PhD  Referring MD: Marda Stalker, PA-C   CC: follow up  History of Present Illness:    Virginia Fischer is a 65 y.o. female without significant PMH who is seen for follow-up. I initially met her 09/04/2019 as a new consult at the request of Marda Stalker, PA-C for the evaluation and management of palpitations.  Tachycardia/palpitations: -Initial onset: first noticed >15-20 years ago, but increased in 06/2019. Was walking 4 mi/3x per week without issues. Felt like her heart was skipping a beat. Stopped, but then the next day in the afternoon it came back and was worse. Felt chills/shaking at the time, better when she lay down. Felt like hard beats and skips. Settled down, has improved. Has a lot of medical anxiety per her description. Retired a few years ago, has had a hard time dealing with that, misses work. During the time her symptoms occurred, daughter/fiancee and her son all moved back home for a time. Mother has afib,  -Frequency/Duration: currently improved. Had been 1-2x/year before, events in July of this year were worst ever. -Associated symptoms: no chest pain, shortness of breath, dizziness, or presyncopal symptoms at the time. -Aggravating/alleviating factors: no clear related factors. Has improved with staying hydrated, laying on back instead of side when she sleeps, doing meditation app. Thinks it might have helped. -Family history: mother has afib. Mat gpa had pacemaker and mat gma had CHF, died of MI in her late 41s. Gena Fray had stroke late in life. Older sister with HTN, younger sister healthy.  Today: Overall, she believes she is feeling better than she was prior. However, she had a recurring event similar to the event 2 years ago. Her most recent event of palpitations lasted for about 5 weeks,  with 5 or 6 days of severe palpitations.  We reviewed her monitor results in detail which were reassuring. After discussion today, she believes her palpitations may be due to her anxiety.  She denies any chest pain, or shortness of breath. No lightheadedness, headaches, syncope, orthopnea, PND, lower extremity edema or exertional symptoms.  History reviewed. No pertinent past medical history.  History reviewed. No pertinent surgical history.  Current Medications: No current outpatient medications on file prior to visit.   No current facility-administered medications on file prior to visit.     Allergies:   Codeine   Social History   Tobacco Use   Smoking status: Never   Smokeless tobacco: Never      Family History: mother has afib. Mat gpa had pacemaker and mat gma had CHF, died of MI in her late 63s. Gena Fray had stroke late in life. Older sister with HTN, younger sister healthy.  ROS:   Please see the history of present illness. (+) Palpitations (+) Anxiety All other systems are reviewed and negative.    EKGs/Labs/Other Studies Reviewed:    The following studies were reviewed today:  Monitor 08/2021-09/2021: ~14 days of data recorded on Zio monitor. Patient had a min HR of 52 bpm, max HR of 182 bpm, and avg HR of 74 bpm. Predominant underlying rhythm was Sinus Rhythm. 4 Supraventricular Tachycardia runs occurred, the run with the fastest interval lasting 9 beats with a max rate of 182 bpm (avg 132 bpm); the run with the fastest interval was also the longest. No VT, atrial  fibrillation, high degree block, or pauses noted. Isolated atrial and ventricular ectopy was rare (<1%). There were 2 triggered events, one sinus and one sinus with PACs. No high risk arrhythmias noted.  EKG:  EKG is personally reviewed.   10/26/2021: NSR at 86 bpm 09/04/2019: NSR  Recent Labs: No results found for requested labs within last 8760 hours.   Recent Lipid Panel No results found for:  CHOL, TRIG, HDL, CHOLHDL, VLDL, LDLCALC, LDLDIRECT  Physical Exam:    VS:  BP 132/80 (BP Location: Right Arm, Patient Position: Sitting, Cuff Size: Normal)   Pulse 86   Ht _0  (1.626 m)   Wt 203 lb 14.4 oz (92.5 kg)   SpO2 99%   BMI 35.00 kg/m     Wt Readings from Last 3 Encounters:  10/26/21 203 lb 14.4 oz (92.5 kg)  09/04/19 199 lb 9.6 oz (90.5 kg)    GEN: Well nourished, well developed in no acute distress HEENT: Normal, moist mucous membranes NECK: No JVD CARDIAC: regular rhythm, normal S1 and S2, no murmurs, rubs, gallops.  VASCULAR: Radial and DP pulses 2+ bilaterally. No carotid bruits RESPIRATORY:  Clear to auscultation without rales, wheezing or rhonchi  ABDOMEN: Soft, non-tender, non-distended MUSCULOSKELETAL:  Ambulates independently SKIN: Warm and dry, no edema NEUROLOGIC:  Alert and oriented x 3. No focal neuro deficits noted. PSYCHIATRIC:  Normal affect    ASSESSMENT:    1. Heart palpitations   2. Encounter to discuss test results   3. Cardiac risk counseling   4. Counseling on health promotion and disease prevention     PLAN:    Palpitations:  -reviewed monitor today, very reassuring. She captured an episode on the monitor which was normal sinus rhythm during symptoms. Suggests not an arrhythmic etiology -discussed KardiaMobile -if symptoms recur with increased frequency, she will contact me, and we will order a monitor  Cardiac risk counseling and prevention recommendations: -recommend heart healthy/Mediterranean diet, with whole grains, fruits, vegetable, fish, lean meats, nuts, and olive oil. Limit salt. -recommend moderate walking, 3-5 times/week for 30-50 minutes each session. Aim for at least 150 minutes.week. Goal should be pace of 3 miles/hours, or walking 1.5 miles in 30 minutes -recommend avoidance of tobacco products. Avoid excess alcohol.  Plan for follow up: I would be happy to see her back as needed  Medication Adjustments/Labs and  Tests Ordered: Current medicines are reviewed at length with the patient today.  Concerns regarding medicines are outlined above.   Orders Placed This Encounter  Procedures   EKG 12-Lead    No orders of the defined types were placed in this encounter.  Patient Instructions  Medication Instructions:  Your Physician recommend you continue on your current medication as directed.    *If you need a refill on your cardiac medications before your next appointment, please call your pharmacy*   Lab Work: None ordered today   Testing/Procedures: None ordered today   Follow-Up: At [facility], you and your health needs are our priority.  As part of our continuing mission to provide you with exceptional heart care, we have created designated Provider Care Teams.  These Care Teams include your primary Cardiologist (physician) and Advanced Practice Providers (APPs -  Physician Assistants and Nurse Practitioners) who all work together to provide you with the care you need, when you need it.  We recommend signing up for the patient portal called "MyChart".  Sign up information is provided on this After Visit Summary.  MyChart is used to  connect with patients for Virtual Visits (Telemedicine).  Patients are able to view lab/test results, encounter notes, upcoming appointments, etc.  Non-urgent messages can be sent to your provider as well.   To learn more about what you can do with MyChart, go to [email or web address].    Your next appointment:   As needed   The format for your next appointment:   In Person  Provider:   Buford Dresser, MD      [facility] Stumpf,acting as a scribe for Buford Dresser, MD.,have documented all relevant documentation on the behalf of Buford Dresser, MD,as directed by  Buford Dresser, MD while in the presence of Buford Dresser, MD.  I, Buford Dresser, MD, have reviewed all documentation for this visit. The  documentation on 10/26/21 for the exam, diagnosis, procedures, and orders are all accurate and complete.   Signed, Buford Dresser, MD PhD 10/26/2021 5:07 PM    [place]

## 2021-10-26 NOTE — Patient Instructions (Signed)

## 2021-11-24 ENCOUNTER — Ambulatory Visit: Payer: BC Managed Care – PPO | Admitting: Physician Assistant

## 2021-11-24 ENCOUNTER — Ambulatory Visit: Payer: Self-pay

## 2021-11-24 ENCOUNTER — Encounter: Payer: Self-pay | Admitting: Physician Assistant

## 2021-11-24 DIAGNOSIS — G8929 Other chronic pain: Secondary | ICD-10-CM

## 2021-11-24 DIAGNOSIS — M25561 Pain in right knee: Secondary | ICD-10-CM | POA: Diagnosis not present

## 2021-11-24 DIAGNOSIS — M1711 Unilateral primary osteoarthritis, right knee: Secondary | ICD-10-CM | POA: Diagnosis not present

## 2021-11-24 MED ORDER — LIDOCAINE HCL 1 % IJ SOLN
3.0000 mL | INTRAMUSCULAR | Status: AC | PRN
  Administered 2021-11-24: 3 mL

## 2021-11-24 MED ORDER — METHYLPREDNISOLONE ACETATE 40 MG/ML IJ SUSP
40.0000 mg | INTRAMUSCULAR | Status: AC | PRN
  Administered 2021-11-24: 40 mg via INTRA_ARTICULAR

## 2021-11-24 NOTE — Progress Notes (Signed)
   Procedure Note  Patient: Virginia Fischer             Date of Birth: 1956/03/22           MRN: [id number]             Visit Date: 11/24/2021  HPI Mrs. Barrier returns today for right knee pain.  She was given a cortisone injection in her knee on 07/11/2021.  She states this helped for about 2 months.  She is concerned that there may be a tear in her knee due to the fact she is having sharp pain in the knee.  She has not had no swelling.  No real mechanical symptoms.  Pain sharp medial aspect knee.  She is also having some groin pain.  She did see general surgery they stated that there was no evidence of hernia.  Again radiographs of her hip showed a well-preserved right hip with no acute fractures or bony abnormalities.  She states the pain in her hip is not as bad as the knee pain though.  She states she has significant pain going up and down stairs.  She states it is greatly affecting her quality of life that she is unable to walk for any long distance.  She is quite frustrated at this point time.  Review of systems see HPI otherwise negative  Physical exam: Right knee good range of motion knee no abnormal warmth erythema or effusion.  Tenderness along medial joint line.  Considerable patellofemoral crepitus.  Radiographs: Right knee 2 views: Showed no acute fracture.  Moderate medial compartmental arthritis.  Moderate to severe patellofemoral arthritis and mild lateral compartmental arthritis with periarticular spurring.  No acute fractures.  Knee is well located.   Procedures: Visit Diagnoses:  1. Chronic pain of right knee   2. Primary osteoarthritis of right knee     Large Joint Inj: R knee on 11/24/2021 4:35 PM Indications: pain Details: 22 G 1.5 in needle, anterolateral approach  Arthrogram: No  Medications: 3 mL lidocaine 1 %; 40 mg methylPREDNISolone acetate 40 MG/ML Outcome: tolerated well, no immediate complications Procedure, treatment alternatives, risks and benefits  explained, specific risks discussed. Consent was given by the patient. Immediately prior to procedure a time out was called to verify the correct patient, procedure, equipment, support staff and site/side marked as required. Patient was prepped and draped in the usual sterile fashion.    Plan: We will see how she does with the injection.  We will also order an MRI to better assess the cartilage in her knee also rule out any type of internal derangement.  Questions were encouraged and answered at length.  She will follow-up after the MRI to go over results discuss further treatment.

## 2021-11-25 NOTE — Addendum Note (Signed)
Addended by: Barbette Or on: 11/25/2021 08:25 AM   Modules accepted: Orders

## 2021-12-16 ENCOUNTER — Other Ambulatory Visit: Payer: BC Managed Care – PPO

## 2022-02-24 ENCOUNTER — Ambulatory Visit
Admission: RE | Admit: 2022-02-24 | Discharge: 2022-02-24 | Disposition: A | Discharge status: Home / Self Care | Payer: BC Managed Care – PPO | Source: Ambulatory Visit | Attending: Family Medicine | Admitting: Family Medicine

## 2022-02-24 ENCOUNTER — Other Ambulatory Visit: Payer: Self-pay | Admitting: Family Medicine

## 2022-02-24 DIAGNOSIS — Z1231 Encounter for screening mammogram for malignant neoplasm of breast: Secondary | ICD-10-CM

## 2022-03-27 ENCOUNTER — Ambulatory Visit
Admission: RE | Admit: 2022-03-27 | Discharge: 2022-03-27 | Disposition: A | Discharge status: Home / Self Care | Payer: BC Managed Care – PPO | Source: Ambulatory Visit | Attending: Physician Assistant | Admitting: Physician Assistant

## 2022-03-27 DIAGNOSIS — G8929 Other chronic pain: Secondary | ICD-10-CM

## 2022-04-10 ENCOUNTER — Ambulatory Visit: Payer: BC Managed Care – PPO | Admitting: Orthopaedic Surgery

## 2022-04-10 ENCOUNTER — Encounter: Payer: Self-pay | Admitting: Orthopaedic Surgery

## 2022-04-10 DIAGNOSIS — M25561 Pain in right knee: Secondary | ICD-10-CM | POA: Diagnosis not present

## 2022-04-10 DIAGNOSIS — M1711 Unilateral primary osteoarthritis, right knee: Secondary | ICD-10-CM | POA: Diagnosis not present

## 2022-04-10 DIAGNOSIS — G8929 Other chronic pain: Secondary | ICD-10-CM

## 2022-04-10 NOTE — Progress Notes (Signed)
The patient comes in today to go over the MRI of the right knee.  She is a very pleasant 66 year old female who has been hurting since June of this past year with her knee.  There has been locking and catching but also aching type of pain.  It is mainly at the patellofemoral joint and medial with her knee.  Her x-rays show look like well-maintained joint space so is appropriate to obtain an MRI of her right knee.  1 steroid injection helped her greatly and last about 2 months but the second did not help at all with her right knee. ? ?She continues to have medial tenderness of the right knee and patellofemoral crepitation. ? ?MRI of her right knee unfortunately shows full-thickness cartilage loss in several areas of the weightbearing surface of the right knee at the medial compartment with both the medial femoral condyle and medial tibial plateau as well as significant cartilage loss of the patellofemoral joint.  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 ? ?I showed her knee model and explained in detail what this means.  An arthroscopic intervention would not really help treat the arthritic changes in her knee.  It could certainly help with some mechanical symptoms or locking catching but her arthritis is quite significant and extensive.  I ended up recommending a knee replacement.  I showed her knee replacement model and described the rationale behind this type of recommendation.  I talked about the risks and benefits of surgery and what to expect from an intraoperative and postoperative course.  All questions and concerns were answered and addressed.  We will work on getting her on the schedule for sometime this summer.  We will be in touch. ?

## 2022-04-18 ENCOUNTER — Encounter: Payer: Self-pay | Admitting: Orthopaedic Surgery

## 2022-06-01 ENCOUNTER — Other Ambulatory Visit: Payer: Self-pay

## 2022-07-07 ENCOUNTER — Other Ambulatory Visit: Payer: Self-pay | Admitting: Physician Assistant

## 2022-07-07 DIAGNOSIS — M1711 Unilateral primary osteoarthritis, right knee: Secondary | ICD-10-CM

## 2022-07-07 NOTE — Patient Instructions (Signed)
SURGICAL WAITING ROOM VISITATION Patients having surgery or a procedure may have no more than 2 support people in the waiting area - these visitors may rotate.   Children under the age of 10 must have an adult with them who is not the patient. If the patient needs to stay at the hospital during part of their recovery, the visitor guidelines for inpatient rooms apply. Pre-op nurse will coordinate an appropriate time for 1 support person to accompany patient in pre-op.  This support person may not rotate.    Please refer to the [facility] website for the visitor guidelines for Inpatients (after your surgery is over and you are in a regular room).    Your procedure is scheduled on: 07/14/22   Report to [facility] Main Entrance    Report to admitting at 5:45 AM   Call this number if you have problems the morning of surgery [phone]   Do not eat food :After Midnight.   After Midnight you may have the following liquids until 5:15 AM DAY OF SURGERY  Water Non-Citrus Juices (without pulp, NO RED) Carbonated Beverages Black Coffee (NO MILK/CREAM OR CREAMERS, sugar ok)  Clear Tea (NO MILK/CREAM OR CREAMERS, sugar ok) regular and decaf                             Plain Jell-O (NO RED)                                           Fruit ices (not with fruit pulp, NO RED)                                     Popsicles (NO RED)                                                               Sports drinks like Gatorade (NO RED)                  The day of surgery:  Drink ONE (1) Pre-Surgery Clear Ensure at 5:15 AM the morning of surgery. Drink in one sitting. Do not sip.  This drink was given to you during your hospital  pre-op appointment visit. Nothing else to drink after completing the  Pre-Surgery Clear Ensure.          If you have questions, please contact your surgeon's office.   FOLLOW BOWEL PREP AND ANY ADDITIONAL PRE OP INSTRUCTIONS YOU RECEIVED FROM YOUR SURGEON'S  OFFICE!!!     Oral Hygiene is also important to reduce your risk of infection.                                    Remember - BRUSH YOUR TEETH THE MORNING OF SURGERY WITH YOUR REGULAR TOOTHPASTE   Take these medicines the morning of surgery with A SIP OF WATER: Tylenol  You may not have any metal on your body including hair pins, jewelry, and body piercing             Do not wear make-up, lotions, powders, perfumes, or deodorant  Do not wear nail polish including gel and S&S, artificial/acrylic nails, or any other type of covering on natural nails including finger and toenails. If you have artificial nails, gel coating, etc. that needs to be removed by a nail salon please have this removed prior to surgery or surgery may need to be canceled/ delayed if the surgeon/ anesthesia feels like they are unable to be safely monitored.   Do not shave  48 hours prior to surgery.    Do not bring valuables to the hospital. [facility] IS NOT             RESPONSIBLE   FOR VALUABLES.   Contacts, dentures or bridgework may not be worn into surgery.   Bring small overnight bag day of surgery.   DO NOT BRING YOUR HOME MEDICATIONS TO THE HOSPITAL. PHARMACY WILL DISPENSE MEDICATIONS LISTED ON YOUR MEDICATION LIST TO YOU DURING YOUR ADMISSION IN THE HOSPITAL!    Special Instructions: Bring a copy of your healthcare power of attorney and living will documents         the day of surgery if you haven't scanned them before.              Please read over the following fact sheets you were given: IF YOU HAVE QUESTIONS ABOUT YOUR PRE-OP INSTRUCTIONS PLEASE CALL [phone]- [facility] - Preparing for Surgery Before surgery, you can play an important role.  Because skin is not sterile, your skin needs to be as free of germs as possible.  You can reduce the number of germs on your skin by washing with CHG (chlorahexidine gluconate) soap before surgery.  CHG is an antiseptic  cleaner which kills germs and bonds with the skin to continue killing germs even after washing. Please DO NOT use if you have an allergy to CHG or antibacterial soaps.  If your skin becomes reddened/irritated stop using the CHG and inform your nurse when you arrive at Short Stay. Do not shave (including legs and underarms) for at least 48 hours prior to the first CHG shower.  You may shave your face/neck.  Please follow these instructions carefully:  1.  Shower with CHG Soap the night before surgery and the  morning of surgery.  2.  If you choose to wash your hair, wash your hair first as usual with your normal  shampoo.  3.  After you shampoo, rinse your hair and body thoroughly to remove the shampoo.                             4.  Use CHG as you would any other liquid soap.  You can apply chg directly to the skin and wash.  Gently with a scrungie or clean washcloth.  5.  Apply the CHG Soap to your body ONLY FROM THE NECK DOWN.   Do   not use on face/ open                           Wound or open sores. Avoid contact with eyes, ears mouth and   genitals (private parts).  Wash face,  Genitals (private parts) with your normal soap.             6.  Wash thoroughly, paying special attention to the area where your    surgery  will be performed.  7.  Thoroughly rinse your body with warm water from the neck down.  8.  DO NOT shower/wash with your normal soap after using and rinsing off the CHG Soap.                9.  Pat yourself dry with a clean towel.            10.  Wear clean pajamas.            11.  Place clean sheets on your bed the night of your first shower and do not  sleep with pets. Day of Surgery : Do not apply any lotions/deodorants the morning of surgery.  Please wear clean clothes to the hospital/surgery center.  FAILURE TO FOLLOW THESE INSTRUCTIONS MAY RESULT IN THE CANCELLATION OF YOUR SURGERY  PATIENT SIGNATURE_________________________________  NURSE  SIGNATURE__________________________________  ________________________________________________________________________    WHAT IS A BLOOD TRANSFUSION? Blood Transfusion Information  A transfusion is the replacement of blood or some of its parts. Blood is made up of multiple cells which provide different functions. Red blood cells carry oxygen and are used for blood loss replacement. White blood cells fight against infection. Platelets control bleeding. Plasma helps clot blood. Other blood products are available for specialized needs, such as hemophilia or other clotting disorders. BEFORE THE TRANSFUSION  Who gives blood for transfusions?  Healthy volunteers who are fully evaluated to make sure their blood is safe. This is blood bank blood. Transfusion therapy is the safest it has ever been in the practice of medicine. Before blood is taken from a donor, a complete history is taken to make sure that person has no history of diseases nor engages in risky social behavior (examples are intravenous drug use or sexual activity with multiple partners). The donor's travel history is screened to minimize risk of transmitting infections, such as malaria. The donated blood is tested for signs of infectious diseases, such as HIV and hepatitis. The blood is then tested to be sure it is compatible with you in order to minimize the chance of a transfusion reaction. If you or a relative donates blood, this is often done in anticipation of surgery and is not appropriate for emergency situations. It takes many days to process the donated blood. RISKS AND COMPLICATIONS Although transfusion therapy is very safe and saves many lives, the main dangers of transfusion include:  Getting an infectious disease. Developing a transfusion reaction. This is an allergic reaction to something in the blood you were given. Every precaution is taken to prevent this. The decision to have a blood transfusion has been considered  carefully by your caregiver before blood is given. Blood is not given unless the benefits outweigh the risks. AFTER THE TRANSFUSION Right after receiving a blood transfusion, you will usually feel much better and more energetic. This is especially true if your red blood cells have gotten low (anemic). The transfusion raises the level of the red blood cells which carry oxygen, and this usually causes an energy increase. The nurse administering the transfusion will monitor you carefully for complications. HOME CARE INSTRUCTIONS  No special instructions are needed after a transfusion. You may find your energy is better. Speak with your caregiver about any limitations on activity for underlying  diseases you may have. SEEK MEDICAL CARE IF:  Your condition is not improving after your transfusion. You develop redness or irritation at the intravenous (IV) site. SEEK IMMEDIATE MEDICAL CARE IF:  Any of the following symptoms occur over the next 12 hours: Shaking chills. You have a temperature by mouth above 102 F (38.9 C), not controlled by medicine. Chest, back, or muscle pain. People around you feel you are not acting correctly or are confused. Shortness of breath or difficulty breathing. Dizziness and fainting. You get a rash or develop hives. You have a decrease in urine output. Your urine turns a dark color or changes to pink, red, or brown. Any of the following symptoms occur over the next 10 days: You have a temperature by mouth above 102 F (38.9 C), not controlled by medicine. Shortness of breath. Weakness after normal activity. The white part of the eye turns yellow (jaundice). You have a decrease in the amount of urine or are urinating less often. Your urine turns a dark color or changes to pink, red, or brown. Document Released: 12/01/2000 Document Revised: 02/26/2012 Document Reviewed: 07/20/2008 [employer] Patient Information 2014 [employer]  [place].  _______________________________________________________________________   Incentive Spirometer  An incentive spirometer is a tool that can help keep your lungs clear and active. This tool measures how well you are filling your lungs with each breath. Taking long deep breaths may help reverse or decrease the chance of developing breathing (pulmonary) problems (especially infection) following: A long period of time when you are unable to move or be active. BEFORE THE PROCEDURE  If the spirometer includes an indicator to show your best effort, your nurse or respiratory therapist will set it to a desired goal. If possible, sit up straight or lean slightly forward. Try not to slouch. Hold the incentive spirometer in an upright position. INSTRUCTIONS FOR USE  Sit on the edge of your bed if possible, or sit up as far as you can in bed or on a chair. Hold the incentive spirometer in an upright position. Breathe out normally. Place the mouthpiece in your mouth and seal your lips tightly around it. Breathe in slowly and as deeply as possible, raising the piston or the ball toward the top of the column. Hold your breath for 3-5 seconds or for as long as possible. Allow the piston or ball to fall to the bottom of the column. Remove the mouthpiece from your mouth and breathe out normally. Rest for a few seconds and repeat Steps 1 through 7 at least 10 times every 1-2 hours when you are awake. Take your time and take a few normal breaths between deep breaths. The spirometer may include an indicator to show your best effort. Use the indicator as a goal to work toward during each repetition. After each set of 10 deep breaths, practice coughing to be sure your lungs are clear. If you have an incision (the cut made at the time of surgery), support your incision when coughing by placing a pillow or rolled up towels firmly against it. Once you are able to get out of bed, walk around indoors and cough well.  You may stop using the incentive spirometer when instructed by your caregiver.  RISKS AND COMPLICATIONS Take your time so you do not get dizzy or light-headed. If you are in pain, you may need to take or ask for pain medication before doing incentive spirometry. It is harder to take a deep breath if you are having pain. AFTER USE  Rest and breathe slowly and easily. It can be helpful to keep track of a log of your progress. Your caregiver can provide you with a simple table to help with this. If you are using the spirometer at home, follow these instructions: SEEK MEDICAL CARE IF:  You are having difficultly using the spirometer. You have trouble using the spirometer as often as instructed. Your pain medication is not giving enough relief while using the spirometer. You develop fever of 100.5 F (38.1 C) or higher. SEEK IMMEDIATE MEDICAL CARE IF:  You cough up bloody sputum that had not been present before. You develop fever of 102 F (38.9 C) or greater. You develop worsening pain at or near the incision site. MAKE SURE YOU:  Understand these instructions. Will watch your condition. Will get help right away if you are not doing well or get worse. Document Released: 04/16/2007 Document Revised: 02/26/2012 Document Reviewed: 06/17/2007 [facility] Patient Information 2014 [place], [place].   ________________________________________________________________________

## 2022-07-07 NOTE — Progress Notes (Signed)
COVID Vaccine Completed: yes x2  Date of COVID positive in last 90 days:  PCP - Jarrett Soho, PA Cardiologist -   Chest x-ray -  EKG - 10/26/21 Epic Stress Test -  ECHO -  Cardiac Cath -  Pacemaker/ICD device last checked: Spinal Cord Stimulator:  Bowel Prep -   Sleep Study -  CPAP -   Fasting Blood Sugar -  Checks Blood Sugar _____ times a day  Blood Thinner Instructions: Aspirin Instructions: Last Dose:  Activity level:  Can go up a flight of stairs and perform activities of daily living without stopping and without symptoms of chest pain or shortness of breath.  Able to exercise without symptoms  Unable to go up a flight of stairs without symptoms of     Anesthesia review:   Patient denies shortness of breath, fever, cough and chest pain at PAT appointment  Patient verbalized understanding of instructions that were given to them at the PAT appointment. Patient was also instructed that they will need to review over the PAT instructions again at home before surgery.

## 2022-07-07 NOTE — Progress Notes (Signed)
Please place orders for PAT appointment scheduled 07/10/22. 

## 2022-07-10 ENCOUNTER — Encounter (HOSPITAL_COMMUNITY): Payer: Self-pay

## 2022-07-10 ENCOUNTER — Encounter (HOSPITAL_COMMUNITY)
Admission: RE | Admit: 2022-07-10 | Discharge: 2022-07-10 | Disposition: A | Discharge status: Home / Self Care | Payer: BC Managed Care – PPO | Source: Ambulatory Visit | Attending: Orthopaedic Surgery | Admitting: Orthopaedic Surgery

## 2022-07-10 VITALS — BP 137/85 | HR 83 | Temp 98.8°F | Resp 14 | Ht 64.0 in | Wt 203.0 lb

## 2022-07-10 DIAGNOSIS — I2510 Atherosclerotic heart disease of native coronary artery without angina pectoris: Secondary | ICD-10-CM

## 2022-07-10 DIAGNOSIS — Z01812 Encounter for preprocedural laboratory examination: Secondary | ICD-10-CM | POA: Insufficient documentation

## 2022-07-10 DIAGNOSIS — M1711 Unilateral primary osteoarthritis, right knee: Secondary | ICD-10-CM | POA: Diagnosis not present

## 2022-07-10 DIAGNOSIS — Z01818 Encounter for other preprocedural examination: Secondary | ICD-10-CM

## 2022-07-10 HISTORY — DX: Unspecified osteoarthritis, unspecified site: M19.90

## 2022-07-10 LAB — CBC
HCT: 42.5 % (ref 36.0–46.0)
Hemoglobin: 13.9 g/dL (ref 12.0–15.0)
MCH: 30.6 pg (ref 26.0–34.0)
MCHC: 32.7 g/dL (ref 30.0–36.0)
MCV: 93.6 fL (ref 80.0–100.0)
Platelets: 346 10*3/uL (ref 150–400)
RBC: 4.54 MIL/uL (ref 3.87–5.11)
RDW: 13.7 % (ref 11.5–15.5)
WBC: 6.4 10*3/uL (ref 4.0–10.5)
nRBC: 0 % (ref 0.0–0.2)

## 2022-07-10 LAB — BASIC METABOLIC PANEL
Anion gap: 5 (ref 5–15)
BUN: 19 mg/dL (ref 8–23)
CO2: 26 mmol/L (ref 22–32)
Calcium: 9.2 mg/dL (ref 8.9–10.3)
Chloride: 108 mmol/L (ref 98–111)
Creatinine, Ser: 0.71 mg/dL (ref 0.44–1.00)
GFR, Estimated: >60 mL/min (ref >60)
Glucose, Bld: 104 mg/dL — ABNORMAL HIGH (ref 70–99)
Potassium: 4.3 mmol/L (ref 3.5–5.1)
Sodium: 139 mmol/L (ref 135–145)

## 2022-07-10 LAB — SURGICAL PCR SCREEN
MRSA, PCR: NEGATIVE
Staphylococcus aureus: NEGATIVE

## 2022-07-13 DIAGNOSIS — M1711 Unilateral primary osteoarthritis, right knee: Secondary | ICD-10-CM

## 2022-07-13 NOTE — H&P (Signed)
TOTAL KNEE ADMISSION H&P  Patient is being admitted for right total knee arthroplasty.  Subjective:  Chief Complaint:right knee pain.  HPI: [employer], 66 y.o. female, has a history of pain and functional disability in the right knee due to arthritis and has failed non-surgical conservative treatments for greater than 12 weeks to includeNSAID's and/or analgesics, corticosteriod injections, viscosupplementation injections, flexibility and strengthening excercises, and activity modification.  Onset of symptoms was gradual, starting 1 years ago with gradually worsening course since that time. The patient noted no past surgery on the right knee(s).  Patient currently rates pain in the right knee(s) at 10 out of 10 with activity. Patient has night pain, worsening of pain with activity and weight bearing, pain that interferes with activities of daily living, pain with passive range of motion, crepitus, and joint swelling.  Patient has evidence of subchondral sclerosis, periarticular osteophytes, and joint space narrowing by imaging studies. There is no active infection.  Patient Active Problem List   Diagnosis Date Noted   Unilateral primary osteoarthritis, right knee 07/13/2022   Past Medical History:  Diagnosis Date   Arthritis     Past Surgical History:  Procedure Laterality Date   CESAREAN SECTION  1995    No current facility-administered medications for this encounter.   Current Outpatient Medications  Medication Sig Dispense Refill Last Dose   acetaminophen (TYLENOL) 650 MG CR tablet Take 650 mg by mouth every morning.      cholecalciferol (VITAMIN D3) 25 MCG (1000 UNIT) tablet Take 1,000 Units by mouth daily.      diclofenac Sodium (VOLTAREN) 1 % GEL Apply 1 Application topically daily.      naproxen sodium (ALEVE) 220 MG tablet Take 440 mg by mouth at bedtime.      vitamin B-12 (CYANOCOBALAMIN) 1000 MCG tablet Take 1,000 mcg by mouth daily.      Allergies  Allergen Reactions    Codeine Other (See Comments)    Pt unsure of reaction     Social History   Tobacco Use   Smoking status: Never   Smokeless tobacco: Never  Substance Use Topics   Alcohol use: Yes    Alcohol/week: 7.0 - 14.0 standard drinks of alcohol    Types: 7 - 14 Glasses of wine per week    Family History  Problem Relation Age of Onset   Hypertension Mother    Arrhythmia Mother    Hypertension Sister    Heart failure Maternal Grandmother    Cancer Maternal Grandfather    Cancer Paternal Grandmother    Stroke Paternal Grandfather    Breast cancer Neg Hx      Review of Systems  Musculoskeletal:  Positive for joint swelling.  All other systems reviewed and are negative.   Objective:  Physical Exam Vitals reviewed.  Constitutional:      Appearance: Normal appearance.  HENT:     Head: Normocephalic and atraumatic.  Eyes:     Extraocular Movements: Extraocular movements intact.     Pupils: Pupils are equal, round, and reactive to light.  Cardiovascular:     Rate and Rhythm: Normal rate and regular rhythm.  Pulmonary:     Effort: Pulmonary effort is normal.     Breath sounds: Normal breath sounds.  Abdominal:     Palpations: Abdomen is soft.  Musculoskeletal:     Cervical back: Normal range of motion and neck supple.     Right knee: Effusion, bony tenderness and crepitus present. Decreased range of motion. Tenderness  present over the medial joint line and lateral joint line. Abnormal meniscus.  Neurological:     Mental Status: She is alert and oriented to person, place, and time.  Psychiatric:        Behavior: Behavior normal.     Vital signs in last 24 hours:    Labs:   Estimated body mass index is 34.84 kg/m as calculated from the following:   Height as of 07/10/22: 5\' 4"  (1.626 m).   Weight as of 07/10/22: 92.1 kg.   Imaging Review Plain radiographs demonstrate severe degenerative joint disease of the right knee(s). The overall alignment isneutral. The bone  quality appears to be excellent for age and reported activity level.      Assessment/Plan:  End stage arthritis, right knee   The patient history, physical examination, clinical judgment of the provider and imaging studies are consistent with end stage degenerative joint disease of the right knee(s) and total knee arthroplasty is deemed medically necessary. The treatment options including medical management, injection therapy arthroscopy and arthroplasty were discussed at length. The risks and benefits of total knee arthroplasty were presented and reviewed. The risks due to aseptic loosening, infection, stiffness, patella tracking problems, thromboembolic complications and other imponderables were discussed. The patient acknowledged the explanation, agreed to proceed with the plan and consent was signed. Patient is being admitted for inpatient treatment for surgery, pain control, PT, OT, prophylactic antibiotics, VTE prophylaxis, progressive ambulation and ADL's and discharge planning. The patient is planning to be discharged home with home health services

## 2022-07-14 ENCOUNTER — Observation Stay (HOSPITAL_COMMUNITY)
Admission: RE | Admit: 2022-07-14 | Discharge: 2022-07-16 | Disposition: A | Discharge status: Home Health | Payer: BC Managed Care – PPO | Source: Ambulatory Visit | Attending: Orthopaedic Surgery | Admitting: Orthopaedic Surgery

## 2022-07-14 ENCOUNTER — Other Ambulatory Visit: Payer: Self-pay

## 2022-07-14 ENCOUNTER — Encounter (HOSPITAL_COMMUNITY): Payer: Self-pay | Admitting: Orthopaedic Surgery

## 2022-07-14 ENCOUNTER — Ambulatory Visit (HOSPITAL_COMMUNITY): Payer: BC Managed Care – PPO | Admitting: Certified Registered"

## 2022-07-14 ENCOUNTER — Observation Stay (HOSPITAL_COMMUNITY): Payer: BC Managed Care – PPO

## 2022-07-14 ENCOUNTER — Encounter (HOSPITAL_COMMUNITY)
Admission: RE | Disposition: A | Discharge status: Home Health | Payer: Self-pay | Source: Ambulatory Visit | Attending: Orthopaedic Surgery

## 2022-07-14 DIAGNOSIS — M1711 Unilateral primary osteoarthritis, right knee: Principal | ICD-10-CM

## 2022-07-14 DIAGNOSIS — Z96651 Presence of right artificial knee joint: Secondary | ICD-10-CM

## 2022-07-14 HISTORY — PX: TOTAL KNEE ARTHROPLASTY: SHX125

## 2022-07-14 LAB — TYPE AND SCREEN
ABO/RH(D): A POS
Antibody Screen: NEGATIVE

## 2022-07-14 LAB — ABO/RH: ABO/RH(D): A POS

## 2022-07-14 SURGERY — ARTHROPLASTY, KNEE, TOTAL
Anesthesia: Monitor Anesthesia Care | Site: Knee | Laterality: Right

## 2022-07-14 MED ORDER — KETOROLAC TROMETHAMINE 30 MG/ML IJ SOLN: 15.0000 mg | Freq: Once | INTRAMUSCULAR | Status: DC | PRN

## 2022-07-14 MED ORDER — AMISULPRIDE (ANTIEMETIC) 5 MG/2ML IV SOLN: 10.0000 mg | Freq: Once | INTRAVENOUS | Status: DC | PRN

## 2022-07-14 MED ORDER — ACETAMINOPHEN 325 MG PO TABS: 325.0000 mg | ORAL_TABLET | Freq: Four times a day (QID) | ORAL | Status: DC | PRN

## 2022-07-14 MED ORDER — ONDANSETRON HCL 4 MG PO TABS: 4.0000 mg | ORAL_TABLET | Freq: Four times a day (QID) | ORAL | Status: DC | PRN

## 2022-07-14 MED ORDER — METHOCARBAMOL 500 MG PO TABS
500.0000 mg | ORAL_TABLET | Freq: Four times a day (QID) | ORAL | Status: DC | PRN
  Administered 2022-07-14 – 2022-07-16 (×6): 500 mg via ORAL
  Filled 2022-07-14 (×6): qty 1

## 2022-07-14 MED ORDER — ORAL CARE MOUTH RINSE: 15.0000 mL | Freq: Once | OROMUCOSAL | Status: AC

## 2022-07-14 MED ORDER — 0.9 % SODIUM CHLORIDE (POUR BTL) OPTIME
TOPICAL | Status: DC | PRN
  Administered 2022-07-14: 1000 mL

## 2022-07-14 MED ORDER — METHOCARBAMOL 500 MG IVPB - SIMPLE MED
500.0000 mg | Freq: Four times a day (QID) | INTRAVENOUS | Status: DC | PRN
  Administered 2022-07-14: 500 mg via INTRAVENOUS

## 2022-07-14 MED ORDER — MIDAZOLAM HCL 2 MG/2ML IJ SOLN: 1.0000 mg | Freq: Once | INTRAMUSCULAR | Status: AC

## 2022-07-14 MED ORDER — ACETAMINOPHEN 500 MG PO TABS
1000.0000 mg | ORAL_TABLET | Freq: Once | ORAL | Status: DC
  Filled 2022-07-14: qty 2

## 2022-07-14 MED ORDER — LACTATED RINGERS IV SOLN
INTRAVENOUS | Status: DC
  Administered 2022-07-14: 1000 mL via INTRAVENOUS

## 2022-07-14 MED ORDER — ROPIVACAINE HCL 5 MG/ML IJ SOLN
INTRAMUSCULAR | Status: DC | PRN
  Administered 2022-07-14: 30 mL via PERINEURAL

## 2022-07-14 MED ORDER — DOCUSATE SODIUM 100 MG PO CAPS
100.0000 mg | ORAL_CAPSULE | Freq: Two times a day (BID) | ORAL | Status: DC
  Administered 2022-07-14 – 2022-07-16 (×4): 100 mg via ORAL
  Filled 2022-07-14 (×4): qty 1

## 2022-07-14 MED ORDER — POLYETHYLENE GLYCOL 3350 17 G PO PACK: 17.0000 g | PACK | Freq: Every day | ORAL | Status: DC | PRN

## 2022-07-14 MED ORDER — PANTOPRAZOLE SODIUM 40 MG PO TBEC
40.0000 mg | DELAYED_RELEASE_TABLET | Freq: Every day | ORAL | Status: DC
  Administered 2022-07-14 – 2022-07-16 (×3): 40 mg via ORAL
  Filled 2022-07-14 (×3): qty 1

## 2022-07-14 MED ORDER — TRANEXAMIC ACID-NACL 1000-0.7 MG/100ML-% IV SOLN
1000.0000 mg | INTRAVENOUS | Status: AC
  Administered 2022-07-14: 1000 mg via INTRAVENOUS
  Filled 2022-07-14: qty 100

## 2022-07-14 MED ORDER — DEXAMETHASONE SODIUM PHOSPHATE 10 MG/ML IJ SOLN
INTRAMUSCULAR | Status: DC | PRN
  Administered 2022-07-14: 8 mg
  Administered 2022-07-14: 10 mg

## 2022-07-14 MED ORDER — ALUM & MAG HYDROXIDE-SIMETH 200-200-20 MG/5ML PO SUSP: 30.0000 mL | ORAL | Status: DC | PRN

## 2022-07-14 MED ORDER — HYDROMORPHONE HCL 2 MG PO TABS
2.0000 mg | ORAL_TABLET | ORAL | Status: DC | PRN
  Administered 2022-07-14 – 2022-07-15 (×5): 2 mg via ORAL
  Filled 2022-07-14 (×5): qty 1

## 2022-07-14 MED ORDER — CEFAZOLIN SODIUM-DEXTROSE 2-4 GM/100ML-% IV SOLN
2.0000 g | INTRAVENOUS | Status: AC
  Administered 2022-07-14: 2 g via INTRAVENOUS
  Filled 2022-07-14: qty 100

## 2022-07-14 MED ORDER — BUPIVACAINE IN DEXTROSE 0.75-8.25 % IT SOLN
INTRATHECAL | Status: DC | PRN
  Administered 2022-07-14: 1.6 mL via INTRATHECAL

## 2022-07-14 MED ORDER — POVIDONE-IODINE 10 % EX SWAB
2.0000 | Freq: Once | CUTANEOUS | Status: AC
  Administered 2022-07-14: 2 via TOPICAL

## 2022-07-14 MED ORDER — CHLORHEXIDINE GLUCONATE 0.12 % MT SOLN
15.0000 mL | Freq: Once | OROMUCOSAL | Status: AC
  Administered 2022-07-14: 15 mL via OROMUCOSAL

## 2022-07-14 MED ORDER — FENTANYL CITRATE (PF) 100 MCG/2ML IJ SOLN
INTRAMUSCULAR | Status: AC
  Filled 2022-07-14: qty 2

## 2022-07-14 MED ORDER — OXYCODONE HCL 5 MG/5ML PO SOLN: 5.0000 mg | Freq: Once | ORAL | Status: DC | PRN

## 2022-07-14 MED ORDER — PROPOFOL 1000 MG/100ML IV EMUL
INTRAVENOUS | Status: AC
  Filled 2022-07-14: qty 100

## 2022-07-14 MED ORDER — METOCLOPRAMIDE HCL 5 MG PO TABS: 5.0000 mg | ORAL_TABLET | Freq: Three times a day (TID) | ORAL | Status: DC | PRN

## 2022-07-14 MED ORDER — FENTANYL CITRATE PF 50 MCG/ML IJ SOSY: 50.0000 ug | PREFILLED_SYRINGE | Freq: Once | INTRAMUSCULAR | Status: AC

## 2022-07-14 MED ORDER — HYDROMORPHONE HCL 1 MG/ML IJ SOLN
INTRAMUSCULAR | Status: AC
  Filled 2022-07-14: qty 2

## 2022-07-14 MED ORDER — METHOCARBAMOL 500 MG IVPB - SIMPLE MED
INTRAVENOUS | Status: AC
  Filled 2022-07-14: qty 55

## 2022-07-14 MED ORDER — ONDANSETRON HCL 4 MG/2ML IJ SOLN: 4.0000 mg | Freq: Four times a day (QID) | INTRAMUSCULAR | Status: DC | PRN

## 2022-07-14 MED ORDER — METOCLOPRAMIDE HCL 5 MG/ML IJ SOLN: 5.0000 mg | Freq: Three times a day (TID) | INTRAMUSCULAR | Status: DC | PRN

## 2022-07-14 MED ORDER — MIDAZOLAM HCL 2 MG/2ML IJ SOLN
INTRAMUSCULAR | Status: AC
  Administered 2022-07-14: 1 mg via INTRAVENOUS
  Filled 2022-07-14: qty 2

## 2022-07-14 MED ORDER — SODIUM CHLORIDE 0.9 % IV SOLN
INTRAVENOUS | Status: DC
Start: 2022-07-14 — End: 2022-07-16

## 2022-07-14 MED ORDER — ONDANSETRON HCL 4 MG/2ML IJ SOLN: 4.0000 mg | Freq: Once | INTRAMUSCULAR | Status: DC | PRN

## 2022-07-14 MED ORDER — ASPIRIN 81 MG PO CHEW
81.0000 mg | CHEWABLE_TABLET | Freq: Two times a day (BID) | ORAL | Status: DC
  Administered 2022-07-14 – 2022-07-16 (×4): 81 mg via ORAL
  Filled 2022-07-14 (×4): qty 1

## 2022-07-14 MED ORDER — ONDANSETRON HCL 4 MG/2ML IJ SOLN
INTRAMUSCULAR | Status: DC | PRN
  Administered 2022-07-14: 4 mg via INTRAVENOUS

## 2022-07-14 MED ORDER — PROPOFOL 500 MG/50ML IV EMUL
INTRAVENOUS | Status: DC | PRN
  Administered 2022-07-14: 75 ug/kg/min via INTRAVENOUS
  Administered 2022-07-14: 10 mg via INTRAVENOUS

## 2022-07-14 MED ORDER — PHENOL 1.4 % MT LIQD: 1.0000 | OROMUCOSAL | Status: DC | PRN

## 2022-07-14 MED ORDER — BUPIVACAINE-EPINEPHRINE 0.25% -1:200000 IJ SOLN
INTRAMUSCULAR | Status: DC | PRN
  Administered 2022-07-14: 30 mL

## 2022-07-14 MED ORDER — MIDAZOLAM HCL 2 MG/2ML IJ SOLN
INTRAMUSCULAR | Status: AC
  Filled 2022-07-14: qty 2

## 2022-07-14 MED ORDER — DIPHENHYDRAMINE HCL 12.5 MG/5ML PO ELIX: 12.5000 mg | ORAL_SOLUTION | ORAL | Status: DC | PRN

## 2022-07-14 MED ORDER — OXYCODONE HCL 5 MG PO TABS
5.0000 mg | ORAL_TABLET | ORAL | Status: DC | PRN
  Administered 2022-07-15 – 2022-07-16 (×3): 5 mg via ORAL
  Filled 2022-07-14 (×3): qty 1

## 2022-07-14 MED ORDER — CEFAZOLIN SODIUM-DEXTROSE 1-4 GM/50ML-% IV SOLN
1.0000 g | Freq: Four times a day (QID) | INTRAVENOUS | Status: AC
  Administered 2022-07-14 (×2): 1 g via INTRAVENOUS
  Filled 2022-07-14 (×2): qty 50

## 2022-07-14 MED ORDER — FENTANYL CITRATE PF 50 MCG/ML IJ SOSY
PREFILLED_SYRINGE | INTRAMUSCULAR | Status: AC
  Administered 2022-07-14: 50 ug via INTRAVENOUS
  Filled 2022-07-14: qty 2

## 2022-07-14 MED ORDER — HYDROMORPHONE HCL 1 MG/ML IJ SOLN
0.5000 mg | INTRAMUSCULAR | Status: DC | PRN
  Administered 2022-07-15: 1 mg via INTRAVENOUS
  Filled 2022-07-14: qty 1

## 2022-07-14 MED ORDER — SODIUM CHLORIDE 0.9 % IR SOLN
Status: DC | PRN
  Administered 2022-07-14: 1000 mL

## 2022-07-14 MED ORDER — STERILE WATER FOR IRRIGATION IR SOLN
Status: DC | PRN
  Administered 2022-07-14: 2000 mL

## 2022-07-14 MED ORDER — MIDAZOLAM HCL 5 MG/5ML IJ SOLN
INTRAMUSCULAR | Status: DC | PRN
  Administered 2022-07-14: 2 mg via INTRAVENOUS

## 2022-07-14 MED ORDER — VITAMIN B-12 1000 MCG PO TABS
1000.0000 ug | ORAL_TABLET | Freq: Every day | ORAL | Status: DC
  Administered 2022-07-15 – 2022-07-16 (×2): 1000 ug via ORAL
  Filled 2022-07-14 (×2): qty 1

## 2022-07-14 MED ORDER — HYDROMORPHONE HCL 1 MG/ML IJ SOLN
0.2500 mg | INTRAMUSCULAR | Status: DC | PRN
  Administered 2022-07-14 (×2): 0.5 mg via INTRAVENOUS

## 2022-07-14 MED ORDER — ORAL CARE MOUTH RINSE: 15.0000 mL | OROMUCOSAL | Status: DC | PRN

## 2022-07-14 MED ORDER — MENTHOL 3 MG MT LOZG: 1.0000 | LOZENGE | OROMUCOSAL | Status: DC | PRN

## 2022-07-14 MED ORDER — FENTANYL CITRATE (PF) 100 MCG/2ML IJ SOLN
INTRAMUSCULAR | Status: DC | PRN
  Administered 2022-07-14 (×2): 50 ug via INTRAVENOUS

## 2022-07-14 MED ORDER — OXYCODONE HCL 5 MG PO TABS: 5.0000 mg | ORAL_TABLET | Freq: Once | ORAL | Status: DC | PRN

## 2022-07-14 MED ORDER — VITAMIN D 25 MCG (1000 UNIT) PO TABS
1000.0000 [IU] | ORAL_TABLET | Freq: Every day | ORAL | Status: DC
  Administered 2022-07-15 – 2022-07-16 (×2): 1000 [IU] via ORAL
  Filled 2022-07-14 (×2): qty 1

## 2022-07-14 MED ORDER — BUPIVACAINE-EPINEPHRINE (PF) 0.25% -1:200000 IJ SOLN
INTRAMUSCULAR | Status: AC
  Filled 2022-07-14: qty 30

## 2022-07-14 SURGICAL SUPPLY — 66 items
BAG COUNTER SPONGE SURGICOUNT (BAG) ×1 IMPLANT
BAG ZIPLOCK 12X15 (MISCELLANEOUS) ×2 IMPLANT
BENZOIN TINCTURE PRP APPL 2/3 (GAUZE/BANDAGES/DRESSINGS) IMPLANT
BLADE SAG 18X100X1.27 (BLADE) ×2 IMPLANT
BLADE SURG SZ10 CARB STEEL (BLADE) ×4 IMPLANT
BNDG ELASTIC 6X5.8 VLCR STR LF (GAUZE/BANDAGES/DRESSINGS) ×3 IMPLANT
BOWL SMART MIX CTS (DISPOSABLE) IMPLANT
CEMENT BONE R 1X40 (Cement) ×2 IMPLANT
COMP FEM CR CEMT STD SZ5 (Joint) ×2 IMPLANT
COMPONENT FEM CR CEMT STD SZ5 (Joint) IMPLANT
COOLER ICEMAN CLASSIC (MISCELLANEOUS) ×2 IMPLANT
COVER SURGICAL LIGHT HANDLE (MISCELLANEOUS) ×2 IMPLANT
CUFF TOURN SGL QUICK 34 (TOURNIQUET CUFF) ×2
CUFF TRNQT CYL 34X4.125X (TOURNIQUET CUFF) ×1 IMPLANT
DRAPE INCISE IOBAN 66X45 STRL (DRAPES) ×2 IMPLANT
DRAPE U-SHAPE 47X51 STRL (DRAPES) ×2 IMPLANT
DRSG MEPILEX BORDER 4X8 (GAUZE/BANDAGES/DRESSINGS) ×1 IMPLANT
DRSG PAD ABDOMINAL 8X10 ST (GAUZE/BANDAGES/DRESSINGS) ×4 IMPLANT
DURAPREP 26ML APPLICATOR (WOUND CARE) ×2 IMPLANT
ELECT BLADE TIP CTD 4 INCH (ELECTRODE) ×2 IMPLANT
ELECT REM PT RETURN 15FT ADLT (MISCELLANEOUS) ×2 IMPLANT
GAUZE PAD ABD 7.5X8 STRL (GAUZE/BANDAGES/DRESSINGS) ×1 IMPLANT
GAUZE SPONGE 4X4 12PLY STRL (GAUZE/BANDAGES/DRESSINGS) ×2 IMPLANT
GAUZE XEROFORM 1X8 LF (GAUZE/BANDAGES/DRESSINGS) ×1 IMPLANT
GLOVE BIO SURGEON STRL SZ7.5 (GLOVE) ×5 IMPLANT
GLOVE BIOGEL PI IND STRL 8 (GLOVE) ×2 IMPLANT
GLOVE BIOGEL PI IND STRL 8.5 (GLOVE) IMPLANT
GLOVE BIOGEL PI INDICATOR 8 (GLOVE) ×5
GLOVE BIOGEL PI INDICATOR 8.5 (GLOVE) ×1
GLOVE ECLIPSE 8.0 STRL XLNG CF (GLOVE) ×2 IMPLANT
GOWN STRL REUS W/ TWL XL LVL3 (GOWN DISPOSABLE) ×2 IMPLANT
GOWN STRL REUS W/TWL XL LVL3 (GOWN DISPOSABLE) ×4
GUIDE TRIAL TIB UNI KNEE RU (ORTHOPEDIC DISPOSABLE SUPPLIES) ×1 IMPLANT
HANDPIECE INTERPULSE COAX TIP (DISPOSABLE) ×2
HDLS TROCR DRIL PIN KNEE 75 (PIN) ×2
HOLDER FOLEY CATH W/STRAP (MISCELLANEOUS) ×1 IMPLANT
IMMOBILIZER KNEE 20 (SOFTGOODS) ×2
IMMOBILIZER KNEE 20 THIGH 36 (SOFTGOODS) ×1 IMPLANT
INSERT TIB AS PERS SZ CD 12 RT (Insert) ×1 IMPLANT
KIT TURNOVER KIT A (KITS) ×1 IMPLANT
NS IRRIG 1000ML POUR BTL (IV SOLUTION) ×2 IMPLANT
PACK TOTAL KNEE CUSTOM (KITS) ×2 IMPLANT
PAD COLD SHLDR WRAP-ON (PAD) ×2 IMPLANT
PAD ORTHO SHOULDER 7X19 LRG (SOFTGOODS) ×1 IMPLANT
PADDING CAST COTTON 6X4 STRL (CAST SUPPLIES) ×4 IMPLANT
PADDING CAST SYN 6 (CAST SUPPLIES) ×1
PADDING CAST SYNTHETIC 6X4 NS (CAST SUPPLIES) IMPLANT
PIN DRILL HDLS TROCAR 75 4PK (PIN) IMPLANT
PROTECTOR NERVE ULNAR (MISCELLANEOUS) ×2 IMPLANT
SCREW FEMALE HEX FIX 25X2.5 (ORTHOPEDIC DISPOSABLE SUPPLIES) ×1 IMPLANT
SET HNDPC FAN SPRY TIP SCT (DISPOSABLE) ×1 IMPLANT
SET PAD KNEE POSITIONER (MISCELLANEOUS) ×2 IMPLANT
SPIKE FLUID TRANSFER (MISCELLANEOUS) ×1 IMPLANT
STAPLER VISISTAT 35W (STAPLE) IMPLANT
STEM POLY PAT PLY 29M KNEE (Knees) ×1 IMPLANT
STEM TIBIA 5 DEG SZ D R KNEE (Knees) IMPLANT
STRIP CLOSURE SKIN 1/2X4 (GAUZE/BANDAGES/DRESSINGS) IMPLANT
SUT MNCRL AB 4-0 PS2 18 (SUTURE) IMPLANT
SUT VIC AB 0 CT1 27 (SUTURE) ×2
SUT VIC AB 0 CT1 27XBRD ANTBC (SUTURE) ×1 IMPLANT
SUT VIC AB 1 CT1 36 (SUTURE) ×4 IMPLANT
SUT VIC AB 2-0 CT1 27 (SUTURE) ×4
SUT VIC AB 2-0 CT1 TAPERPNT 27 (SUTURE) ×2 IMPLANT
TIBIA STEM 5 DEG SZ D R KNEE (Knees) ×2 IMPLANT
TRAY FOLEY W/BAG SLVR 14FR LF (SET/KITS/TRAYS/PACK) ×1 IMPLANT
WATER STERILE IRR 1000ML POUR (IV SOLUTION) ×4 IMPLANT

## 2022-07-14 NOTE — Anesthesia Procedure Notes (Signed)
Anesthesia Regional Block: Adductor canal block   Pre-Anesthetic Checklist: , timeout performed,  Correct Patient, Correct Site, Correct Laterality,  Correct Procedure, Correct Position, site marked,  Risks and benefits discussed,  Surgical consent,  Pre-op evaluation,  At surgeon's request and post-op pain management  Laterality: Right  Prep: Maximum Sterile Barrier Precautions used, chloraprep       Needles:  Injection technique: Single-shot  Needle Type: Echogenic Stimulator Needle     Needle Length: 9cm  Needle Gauge: 22     Additional Needles:   Procedures:,,,, ultrasound used (permanent image in chart),,    Narrative:  Start time: 07/14/2022 7:55 AM End time: 07/14/2022 8:00 AM Injection made incrementally with aspirations every 5 mL.  Performed by: Personally  Anesthesiologist: Lannie Fields, DO  Additional Notes: Monitors applied. No increased pain on injection. No increased resistance to injection. Injection made in 5cc increments. Good needle visualization. Patient tolerated procedure well.

## 2022-07-14 NOTE — Evaluation (Signed)
Physical Therapy Evaluation Patient Details Name: Virginia Fischer MRN: [id number] DOB: 03/03/1956 Today's Date: 07/14/2022  History of Present Illness  Pt is a 66yo female presenting s/p R-TKA on 07/14/22. No significant PMH.  Clinical Impression  Nicolena KAEDENCE CONNELLY is a 66 y.o. female POD 0 s/p R-TKA. Patient reports IND with mobility at baseline. Patient is now limited by functional impairments (see PT problem list below) and requires min guard for bed mobility and min assist for transfers; further mobility deferred as pt still lacking R dorsiflexion (MMT 0/5), MD aware, reassured pt as she demonstrated some anxiety. Patient instructed in exercise to facilitate ROM and circulation to manage edema. Provided incentive spirometer and with Vcs pt able to achieve . Patient will benefit from continued skilled PT interventions to address impairments and progress towards PLOF. Acute PT will follow to progress mobility and stair training in preparation for safe discharge home.       Recommendations for follow up therapy are one component of a multi-disciplinary discharge planning process, led by the attending physician.  Recommendations may be updated based on patient status, additional functional criteria and insurance authorization.  Follow Up Recommendations Follow physician's recommendations for discharge plan and follow up therapies      Assistance Recommended at Discharge Set up Supervision/Assistance  Patient can return home with the following  A little help with walking and/or transfers;A little help with bathing/dressing/bathroom;Assistance with cooking/housework;Assist for transportation;Help with stairs or ramp for entrance    Equipment Recommendations None recommended by PT  Recommendations for Other Services       Functional Status Assessment Patient has had a recent decline in their functional status and demonstrates the ability to make significant improvements in function in a  reasonable and predictable amount of time.     Precautions / Restrictions Precautions Precautions: Fall Restrictions Weight Bearing Restrictions: No Other Position/Activity Restrictions: WBAT      Mobility  Bed Mobility Overal bed mobility: Needs Assistance Bed Mobility: Supine to Sit     Supine to sit: Min guard     General bed mobility comments: For safety only    Transfers Overall transfer level: Needs assistance Equipment used: Rolling walker (2 wheels) Transfers: Sit to/from Stand, Bed to chair/wheelchair/BSC Sit to Stand: Min guard, From elevated surface   Step pivot transfers: Min assist       General transfer comment: sit to stand; min guard from elevated surface with VCs for sequencing, no physical assist required. Step pivot: min assist to scoot RLE as pt unable to DF, otherwise min guard. No overt LOB. Further mobility deferred.    Ambulation/Gait               General Gait Details: deferred  Stairs            Wheelchair Mobility    Modified Rankin (Stroke Patients Only)       Balance Overall balance assessment: Needs assistance Sitting-balance support: Feet supported, No upper extremity supported Sitting balance-Leahy Scale: Good     Standing balance support: During functional activity, Reliant on assistive device for balance, Bilateral upper extremity supported Standing balance-Leahy Scale: Poor                               Pertinent Vitals/Pain Pain Assessment Pain Assessment: No/denies pain    Home Living Family/patient expects to be discharged to:: Private residence Living Arrangements: Spouse/significant other Available Help at Discharge: Family;Available 24 hours/day  Type of Home: House Home Access: Stairs to enter Entrance Stairs-Rails: Left Entrance Stairs-Number of Steps: 4 Alternate Level Stairs-Number of Steps: 15 Home Layout: Two level Home Equipment: Agricultural consultant (2 wheels)      Prior  Function Prior Level of Function : Independent/Modified Independent;Working/employed;Driving             Mobility Comments: ind ADLs Comments: ind     Hand Dominance        Extremity/Trunk Assessment   Upper Extremity Assessment Upper Extremity Assessment: Overall WFL for tasks assessed    Lower Extremity Assessment Lower Extremity Assessment: RLE deficits/detail;LLE deficits/detail RLE Deficits / Details: Pt unable to dorsiflex foot and does not have sensation in dorsum of foot or medial calf, MD aware. MMT PF 4/5, DF 0/5, no extensor lag noted with SLR RLE Sensation: decreased light touch LLE Deficits / Details: MMT ank DF/PF 5/5 LLE Sensation: WNL    Cervical / Trunk Assessment Cervical / Trunk Assessment: Normal  Communication   Communication: No difficulties  Cognition Arousal/Alertness: Awake/alert Behavior During Therapy: WFL for tasks assessed/performed Overall Cognitive Status: Within Functional Limits for tasks assessed                                          General Comments General comments (skin integrity, edema, etc.): Husband Onalee Hua and Daughter Valentina Gu present    Exercises Total Joint Exercises Ankle Circles/Pumps: AROM, Both, 10 reps   Assessment/Plan    PT Assessment Patient needs continued PT services  PT Problem List Decreased strength;Decreased range of motion;Decreased activity tolerance;Decreased balance;Decreased mobility;Decreased coordination;Decreased knowledge of use of DME;Pain       PT Treatment Interventions DME instruction;Gait training;Stair training;Functional mobility training;Therapeutic activities;Therapeutic exercise;Balance training;Neuromuscular re-education;Patient/family education    PT Goals (Current goals can be found in the Care Plan section)  Acute Rehab PT Goals Patient Stated Goal: to walk without pain PT Goal Formulation: With patient Time For Goal Achievement: 07/21/22 Potential to Achieve  Goals: Good    Frequency 7X/week     Co-evaluation               AM-PAC PT "6 Clicks" Mobility  Outcome Measure Help needed turning from your back to your side while in a flat bed without using bedrails?: None Help needed moving from lying on your back to sitting on the side of a flat bed without using bedrails?: A Little Help needed moving to and from a bed to a chair (including a wheelchair)?: A Little Help needed standing up from a chair using your arms (e.g., wheelchair or bedside chair)?: A Little Help needed to walk in hospital room?: A Little Help needed climbing 3-5 steps with a railing? : A Little 6 Click Score: 19    End of Session Equipment Utilized During Treatment: Gait belt Activity Tolerance: Patient tolerated treatment well;No increased pain Patient left: in chair;with call bell/phone within reach;with chair alarm set;with family/visitor present;with SCD's reapplied Nurse Communication: Mobility status;Patient requests pain meds PT Visit Diagnosis: Pain;Difficulty in walking, not elsewhere classified (R26.2) Pain - Right/Left: Right Pain - part of body: Knee    Time: 5188-4166 PT Time Calculation (min) (ACUTE ONLY): 28 min   Charges:   PT Evaluation $PT Eval Low Complexity: 1 Low PT Treatments $Therapeutic Activity: 8-22 mins        Jamesetta Geralds, PT, DPT [facility] Department Office: [phone] Pager: [phone]  Jamesetta Geralds 07/14/2022, 6:58 PM

## 2022-07-14 NOTE — TOC Transition Note (Signed)
Transition of Care [facility]) - CM/SW Discharge Note   Patient Details  Name: Virginia Fischer MRN: [id number] Date of Birth: 1956-08-19  [facility] CM/SW Contact:  Lennart Pall, LCSW Phone Number: 07/14/2022, 12:49 PM   Clinical Narrative:     Met with pt and confirming she has all needed DME at home.  HHPT prearranged with [facility].  No further TOC needs.  Final next level of care: [place] Barriers to Discharge: No Barriers Identified   Patient Goals and CMS Choice Patient states their goals for this hospitalization and ongoing recovery are:: return home      Discharge Placement                       Discharge Plan and Services                DME Arranged: N/A DME Agency: NA       HH Arranged: PT [place] Agency: [place]        Social Determinants of Health (SDOH) Interventions     Readmission Risk Interventions     No data to display

## 2022-07-14 NOTE — Op Note (Signed)
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 Magnus Ivan, MD Assistant: Rexene Edison, PA-C  Anesthesia: #1 right lower extremity adductor canal block   #2 spinal   #3 local with quarter percent Marcaine with epinephrine Tourniquet time: 50 minutes EBL: Less than 100 cc Antibiotics: 2 g IV Ancef Complications: None  Indications: The patient is a very pleasant and active 66 year old female with debilitating arthritis involving her right knee that has been well-documented.  She has tried and failed all forms of conservative treatment for over 12 months.  At this point her right knee pain is daily and it is detrimentally affecting her mobility, her quality of life, and her actives day living to the point she does wish to proceed with a total knee arthroplasty and we agree with this as well.  We discussed in detail the risks of acute blood loss anemia, nerve or vessel injury, fracture, infection, DVT and soft tissue issues.  She understands her goals are to decrease pain, improve mobility, and improve quality of life.  Procedure description: After informed consent was obtained and the appropriate right knee was marked, anesthesia obtained a right lower extremity adductor canal block in the holding room.  She was then brought to the operating room and set up on the operating table where spinal anesthesia was obtained.  She was laid in supine position on the operating table and a Foley catheter was placed as well as a nonsterile tourniquet placed around her upper right thigh.  Her right thigh, knee, leg and ankle were  prepped and draped with DuraPrep and sterile drapes including a sterile stockinette.  A timeout was called and she was notified is correct patient the correct right knee.  We then used Esmarch to wrap out the right leg and the tourniquet was inflated to 300 mm of pressure.  I then made a direct midline incision over the patella and carried this proximally and distally.  I dissected down to the knee joint and carried out a medial parapatellar arthrotomy finding of a large joint effusion and significant synovitis.  With the knee in a flexed position we removed osteophytes from all 3 compartments as well as remnants of the medial lateral meniscus and the ACL.  We then used an extramedullary cutting guide for making her proximal tibia cut correction for varus and valgus and 3 degree slope.  We made this cut to take 2 mm off the low side.  We made the cut without difficulty.  We then went to the femur and used the intramedullary cutting guide for the femur for a right knee and a 10 mm distal femoral cut set at 5 degrees externally rotated.  We made this cut without difficulty and brought the knee back down full extension and had to achieve full extension with a 10 mm extension block.  We then went back to the femur and put the femoral sizing guide based off the epicondylar axis.  Based off of this we chose a size 5 femur.  We put a 4-in-1 cutting block for a size 5 femur and made her anterior and posterior cuts followed by our chamfer cuts.  We then went back to the tibia and  chose a size D right tibial tray for coverage over the tibial plateau so the rotation of the tibial tubercle and the femur.  We made our drill hole and keel punch off of this.  With the size right the trial tibia, we trialed a size right 5 standard CR femur.  We placed a 10 mm medial congruent fixed-bearing poly insert and went up to a 12 mm insert.  I was pleased with range of motion and stability of the 12 mm insert.  We then made our patella cut  and drilled 3 holes for a size 29 patella button.  With all trial instrumentation knee I put her through several cycles of motion and I was pleased with range of motion and stability.  We then removed all transient mentation from the knee and irrigated the knee with normal saline solution.  We then placed our Marcaine with epinephrine around the arthrotomy.  Next the cement was mixed.  With the knee dried and in a flexed position we cemented our Biomet/Zimmer persona knee system with cementing first of the size D right tibial tray followed by cementing the size 5 CR standard right femur.  We removed excess cement from the knee and placed our 12 mm thickness medial congruent right polythene insert.  We then cemented our size 29 patella button.  I held the knee compressed and fully extended until the cement hardened.  Once it hardened the tourniquet was let down and hemostasis was obtained with electrocautery.  We then closed the arthrotomy with interrupted #1 Vicryl suture followed by 0 Vicryl to close the deep tissue and 2-0 Vicryl to close the subcutaneous tissue.  The skin was closed with staples.  Well-padded sterile dressing is applied.  She was taken recovery in stable addition.  Of note Rexene Edison PA-C assisted in the entire case from beginning to end and his assistance was medically necessary and crucial for retracting and mobilizing soft tissues as well as helping guide implant placement.  He was directly involved with a layered closure of the wound.

## 2022-07-14 NOTE — Anesthesia Preprocedure Evaluation (Addendum)
Anesthesia Evaluation  Patient identified by MRN, date of birth, ID band Patient awake    Reviewed: Allergy & Precautions, NPO status , Patient's Chart, lab work & pertinent test results  Airway Mallampati: IV  TM Distance: >3 FB Neck ROM: Full    Dental  (+) Dental Advisory Given, Teeth Intact   Pulmonary neg pulmonary ROS,    Pulmonary exam normal breath sounds clear to auscultation       Cardiovascular negative cardio ROS Normal cardiovascular exam Rhythm:Regular Rate:Normal     Neuro/Psych negative neurological ROS  negative psych ROS   GI/Hepatic negative GI ROS, Neg liver ROS,   Endo/Other  Obesity BMI 35  Renal/GU negative Renal ROS  negative genitourinary   Musculoskeletal  (+) Arthritis , Osteoarthritis,    Abdominal (+) + obese,   Peds  Hematology negative hematology ROS (+) Hb 13.9, plt 346   Anesthesia Other Findings   Reproductive/Obstetrics negative OB ROS                            Anesthesia Physical Anesthesia Plan  ASA: 2  Anesthesia Plan: Spinal, Regional and MAC   Post-op Pain Management: Regional block* and Tylenol PO (pre-op)*   Induction:   PONV Risk Score and Plan: 2 and Propofol infusion and TIVA  Airway Management Planned: Natural Airway and Nasal Cannula  Additional Equipment: None  Intra-op Plan:   Post-operative Plan:   Informed Consent: I have reviewed the patients History and Physical, chart, labs and discussed the procedure including the risks, benefits and alternatives for the proposed anesthesia with the patient or authorized representative who has indicated his/her understanding and acceptance.       Plan Discussed with: CRNA  Anesthesia Plan Comments:         Anesthesia Quick Evaluation

## 2022-07-14 NOTE — Transfer of Care (Signed)
Immediate Anesthesia Transfer of Care Note  Patient: Virginia Fischer  Procedure(s) Performed: RIGHT TOTAL KNEE ARTHROPLASTY (Right: Knee)  Patient Location: PACU  Anesthesia Type:Spinal  Level of Consciousness: awake  Airway & Oxygen Therapy: Patient Spontanous Breathing  Post-op Assessment: Report given to RN  Post vital signs: stable  Last Vitals:  Vitals Value Taken Time  BP 136/83 07/14/22 1008  Temp 36.7 C 07/14/22 1008  Pulse 89 07/14/22 1014  Resp 15 07/14/22 1014  SpO2 99 % 07/14/22 1014  Vitals shown include unvalidated device data.  Last Pain:  Vitals:   07/14/22 0656  TempSrc:   PainSc: 0-No pain         Complications: No notable events documented.

## 2022-07-14 NOTE — Anesthesia Procedure Notes (Signed)
Spinal  Patient location during procedure: OR Start time: 07/14/2022 8:14 AM End time: 07/14/2022 8:17 AM Reason for block: surgical anesthesia Staffing Performed: anesthesiologist  Anesthesiologist: Lannie Fields, DO Performed by: Lannie Fields, DO Authorized by: Lannie Fields, DO   Preanesthetic Checklist Completed: patient identified, IV checked, risks and benefits discussed, surgical consent, monitors and equipment checked, pre-op evaluation and timeout performed Spinal Block Patient position: sitting Prep: DuraPrep and site prepped and draped Patient monitoring: cardiac monitor, continuous pulse ox and blood pressure Approach: midline Location: L3-4 Injection technique: single-shot Needle Needle type: Pencan  Needle gauge: 24 G Needle length: 9 cm Assessment Sensory level: T6 Events: CSF return Additional Notes Functioning IV was confirmed and monitors were applied. Sterile prep and drape, including hand hygiene and sterile gloves were used. The patient was positioned and the spine was prepped. The skin was anesthetized with lidocaine.  Free flow of clear CSF was obtained prior to injecting local anesthetic into the CSF.  The spinal needle aspirated freely following injection.  The needle was carefully withdrawn.  The patient tolerated the procedure well.

## 2022-07-14 NOTE — Anesthesia Postprocedure Evaluation (Signed)
Anesthesia Post Note  Patient: Virginia Fischer  Procedure(s) Performed: RIGHT TOTAL KNEE ARTHROPLASTY (Right: Knee)     Patient location during evaluation: PACU Anesthesia Type: Regional, MAC and Spinal Level of consciousness: awake and alert and oriented Pain management: pain level controlled Vital Signs Assessment: post-procedure vital signs reviewed and stable Respiratory status: spontaneous breathing, nonlabored ventilation and respiratory function stable Cardiovascular status: blood pressure returned to baseline and stable Postop Assessment: no headache, no backache, spinal receding and patient able to bend at knees Anesthetic complications: no   No notable events documented.  Last Vitals:  Vitals:   07/14/22 1100 07/14/22 1115  BP: 137/84 (!) 144/74  Pulse: 77 81  Resp: 11 15  Temp:    SpO2: 93% 94%    Last Pain:  Vitals:   07/14/22 1100  TempSrc:   PainSc: [place]

## 2022-07-14 NOTE — Interval H&P Note (Signed)
History and Physical Interval Note: The patient understands fully that she is here today for a right knee replacement to treat her right knee osteoarthritis.  There has been no acute or interval change in her medical status.  See H&P.  The risks and benefits of surgery been explained in detail and informed consent is obtained.  The right operative knee has been marked.  07/14/2022 6:54 AM  Virginia Fischer  has presented today for surgery, with the diagnosis of OSTEOARTHRITIS / DEGENERATIVE JOINT DISEASE RIGHT KNEE.  The various methods of treatment have been discussed with the patient and family. After consideration of risks, benefits and other options for treatment, the patient has consented to  Procedure(s): RIGHT TOTAL KNEE ARTHROPLASTY (Right) as a surgical intervention.  The patient's history has been reviewed, patient examined, no change in status, stable for surgery.  I have reviewed the patient's chart and labs.  Questions were answered to the patient's satisfaction.     Kathryne Hitch

## 2022-07-15 DIAGNOSIS — M1711 Unilateral primary osteoarthritis, right knee: Secondary | ICD-10-CM | POA: Diagnosis not present

## 2022-07-15 LAB — BASIC METABOLIC PANEL
Anion gap: 9 (ref 5–15)
BUN: 15 mg/dL (ref 8–23)
CO2: 24 mmol/L (ref 22–32)
Calcium: 8.8 mg/dL — ABNORMAL LOW (ref 8.9–10.3)
Chloride: 106 mmol/L (ref 98–111)
Creatinine, Ser: 0.66 mg/dL (ref 0.44–1.00)
GFR, Estimated: >60 mL/min (ref >60)
Glucose, Bld: 141 mg/dL — ABNORMAL HIGH (ref 70–99)
Potassium: 4.1 mmol/L (ref 3.5–5.1)
Sodium: 139 mmol/L (ref 135–145)

## 2022-07-15 LAB — CBC
HCT: 33.4 % — ABNORMAL LOW (ref 36.0–46.0)
Hemoglobin: 11.1 g/dL — ABNORMAL LOW (ref 12.0–15.0)
MCH: 30.8 pg (ref 26.0–34.0)
MCHC: 33.2 g/dL (ref 30.0–36.0)
MCV: 92.8 fL (ref 80.0–100.0)
Platelets: 281 10*3/uL (ref 150–400)
RBC: 3.6 MIL/uL — ABNORMAL LOW (ref 3.87–5.11)
RDW: 13.5 % (ref 11.5–15.5)
WBC: 13.8 10*3/uL — ABNORMAL HIGH (ref 4.0–10.5)
nRBC: 0 % (ref 0.0–0.2)

## 2022-07-15 LAB — GLUCOSE, CAPILLARY: Glucose-Capillary: 139 mg/dL — ABNORMAL HIGH (ref 70–99)

## 2022-07-15 MED ORDER — OXYCODONE HCL 5 MG PO TABS: 5.0000 mg | ORAL_TABLET | ORAL | 0 refills | Status: DC | PRN

## 2022-07-15 MED ORDER — METHOCARBAMOL 500 MG PO TABS: 500.0000 mg | ORAL_TABLET | Freq: Four times a day (QID) | ORAL | 1 refills | Status: DC | PRN

## 2022-07-15 MED ORDER — ASPIRIN 81 MG PO CHEW: 81.0000 mg | CHEWABLE_TABLET | Freq: Two times a day (BID) | ORAL | 1 refills | Status: DC

## 2022-07-15 NOTE — Discharge Instructions (Signed)

## 2022-07-15 NOTE — Progress Notes (Signed)
Dr. Otelia Sergeant notified that pt is not ready for d/c today. Pt remains stable with no needs.

## 2022-07-15 NOTE — Progress Notes (Signed)
Physical Therapy Treatment Patient Details Name: Virginia Fischer MRN: [id number] DOB: [birth year] Today's Date: 07/15/2022   History of Present Illness Pt is a 65yo female presenting s/p R-TKA on 07/14/22. No significant PMH.    PT Comments    Pt assisted with ambulating to/from bathroom however declined ambulating in hallway at this time (feeling funny still from IV pain meds early this morning).  Pt performed exercises in recliner.  Pt will need to practice steps prior to d/c home.    Recommendations for follow up therapy are one component of a multi-disciplinary discharge planning process, led by the attending physician.  Recommendations may be updated based on patient status, additional functional criteria and insurance authorization.  Follow Up Recommendations  Follow physician's recommendations for discharge plan and follow up therapies     Assistance Recommended at Discharge Set up Supervision/Assistance  Patient can return home with the following A little help with walking and/or transfers;A little help with bathing/dressing/bathroom;Assistance with cooking/housework;Assist for transportation;Help with stairs or ramp for entrance   Equipment Recommendations  None recommended by PT    Recommendations for Other Services       Precautions / Restrictions Precautions Precautions: Fall;Knee Restrictions Other Position/Activity Restrictions: WBAT     Mobility  Bed Mobility Overal bed mobility: Needs Assistance Bed Mobility: Supine to Sit     Supine to sit: Min guard          Transfers Overall transfer level: Needs assistance Equipment used: Rolling walker (2 wheels) Transfers: Sit to/from Stand Sit to Stand: Min guard, From elevated surface           General transfer comment: verbal cues for hand placement    Ambulation/Gait Ambulation/Gait assistance: Min guard Gait Distance (Feet): 10 Feet (x2) Assistive device: Rolling walker (2 wheels) Gait  Pattern/deviations: Step-to pattern, Step-through pattern, Decreased stance time - right, Decreased dorsiflexion - right       General Gait Details: verbal cues for sequence and step length, pt with decreased active DF so cautioned to make sure foot clears floor, pt only felt able to ambulate to/from bathroom, reports feeling "swimmy headed" from IV pain meds early this morning; BP 146/82 mmHg HR 84 bpm upon sitting in recliner   Stairs             Wheelchair Mobility    Modified Rankin (Stroke Patients Only)       Balance                                            Cognition Arousal/Alertness: Awake/alert Behavior During Therapy: WFL for tasks assessed/performed Overall Cognitive Status: Within Functional Limits for tasks assessed                                          Exercises Total Joint Exercises Ankle Circles/Pumps: AROM, 10 reps, Right Quad Sets: 10 reps, Both, AROM Heel Slides: AAROM, Right, 10 reps Hip ABduction/ADduction: AAROM, Right, 10 reps Straight Leg Raises: AAROM, Right, 10 reps    General Comments        Pertinent Vitals/Pain Pain Assessment Pain Assessment: No/denies pain    Home Living  Prior Function            PT Goals (current goals can now be found in the care plan section) Progress towards PT goals: Progressing toward goals    Frequency    7X/week      PT Plan Current plan remains appropriate    Co-evaluation              AM-PAC PT "6 Clicks" Mobility   Outcome Measure  Help needed turning from your back to your side while in a flat bed without using bedrails?: None Help needed moving from lying on your back to sitting on the side of a flat bed without using bedrails?: A Little Help needed moving to and from a bed to a chair (including a wheelchair)?: A Little Help needed standing up from a chair using your arms (e.g., wheelchair or bedside  chair)?: A Little Help needed to walk in hospital room?: A Little Help needed climbing 3-5 steps with a railing? : A Lot 6 Click Score: 18    End of Session Equipment Utilized During Treatment: Gait belt Activity Tolerance: Patient tolerated treatment well;No increased pain Patient left: in chair;with call bell/phone within reach;with family/visitor present   PT Visit Diagnosis: Difficulty in walking, not elsewhere classified (R26.2)     Time: 1994-1290 PT Time Calculation (min) (ACUTE ONLY): 23 min  Charges:  $Gait Training: 8-22 mins $Therapeutic Exercise: 8-22 mins                    Thomasene Mohair PT, DPT Physical Therapist Acute Rehabilitation Services Preferred contact method: Secure Chat Weekend Pager Only: [phone] Office: [phone]   Janan Halter [place] 07/15/2022, 12:02 PM

## 2022-07-15 NOTE — Progress Notes (Signed)
Subjective: 1 Day Post-Op Procedure(s) (LRB): RIGHT TOTAL KNEE ARTHROPLASTY (Right) Patient reports pain as moderate.  She does report right foot weakness and foot drop as well as numbness postoperative.  Objective: Vital signs in last 24 hours: Temp:  [97.8 F (36.6 C)-98.8 F (37.1 C)] 98.8 F (37.1 C) (07/29 0539) Pulse Rate:  [77-98] 89 (07/29 0539) Resp:  [9-18] 17 (07/29 0539) BP: (126-155)/(71-88) 136/76 (07/29 0539) SpO2:  [93 %-100 %] 98 % (07/29 0539) Weight:  [92 kg] 92 kg (07/28 1215)  Intake/Output from previous day: 07/28 0701 - 07/29 0700 In: 1159 [P.O.:480; I.V.:374; IV Piggyback:305] Out: 2675 [Urine:2625; Blood:50] Intake/Output this shift: Total I/O In: 1040 [P.O.:360; I.V.:680] Out: -   Recent Labs    07/15/22 0320  HGB 11.1*   Recent Labs    07/15/22 0320  WBC 13.8*  RBC 3.60*  HCT 33.4*  PLT 281   Recent Labs    07/15/22 0320  NA 139  K 4.1  CL 106  CO2 24  BUN 15  CREATININE 0.66  GLUCOSE 141*  CALCIUM 8.8*   No results for input(s): "LABPT", "INR" in the last 72 hours.  Intact pulses distally Incision: dressing C/D/I Compartment soft  There is slight numbness in her right foot and her foot is well-perfused but there is definitely evidence of foot drop on the right side.   Assessment/Plan: 1 Day Post-Op Procedure(s) (LRB): RIGHT TOTAL KNEE ARTHROPLASTY (Right) Up with therapy I spoke to her in length about foot drop after knee replacement surgery.  This usually will resolve with time but it can take time.  If she does well by the afternoon she can be discharged home versus tomorrow.     Kathryne Hitch 07/15/2022, 9:00 AM

## 2022-07-15 NOTE — Progress Notes (Signed)
Physical Therapy Treatment Patient Details Name: Virginia Fischer MRN: [id number] DOB: 01-30-1956 Today's Date: 07/15/2022   History of Present Illness Pt is a 65yo female presenting s/p R-TKA on 07/14/22. No significant PMH.    PT Comments    Pt ambulated in hallway and practiced safe stair technique.  Pt does not yet feel ready for d/c home, reports likely tomorrow.   Recommendations for follow up therapy are one component of a multi-disciplinary discharge planning process, led by the attending physician.  Recommendations may be updated based on patient status, additional functional criteria and insurance authorization.  Follow Up Recommendations  Follow physician's recommendations for discharge plan and follow up therapies     Assistance Recommended at Discharge Set up Supervision/Assistance  Patient can return home with the following A little help with walking and/or transfers;A little help with bathing/dressing/bathroom;Assistance with cooking/housework;Assist for transportation;Help with stairs or ramp for entrance   Equipment Recommendations  None recommended by PT    Recommendations for Other Services       Precautions / Restrictions Precautions Precautions: Fall;Knee Restrictions Other Position/Activity Restrictions: WBAT     Mobility  Bed Mobility Overal bed mobility: Needs Assistance Bed Mobility: Supine to Sit, Sit to Supine     Supine to sit: Min guard Sit to supine: Min guard        Transfers Overall transfer level: Needs assistance Equipment used: Rolling walker (2 wheels) Transfers: Sit to/from Stand Sit to Stand: Min guard           General transfer comment: verbal cues for hand placement    Ambulation/Gait Ambulation/Gait assistance: Min guard Gait Distance (Feet): 80 Feet Assistive device: Rolling walker (2 wheels) Gait Pattern/deviations: Step-to pattern, Step-through pattern, Decreased stance time - right, Decreased dorsiflexion -  right       General Gait Details: verbal cues for sequence, step length, RW positioning; pt with decreased active DF, no dizziness reported this afternoon, distance to tolerance   Stairs Stairs: Yes Stairs assistance: Min guard Stair Management: Step to pattern, Forwards, Two rails Number of Stairs: 3 General stair comments: verbal cues for sequence and safety, pt performed twice, cautious with R foot placement due to drop foot   Wheelchair Mobility    Modified Rankin (Stroke Patients Only)       Balance                                            Cognition Arousal/Alertness: Awake/alert Behavior During Therapy: WFL for tasks assessed/performed Overall Cognitive Status: Within Functional Limits for tasks assessed                                          Exercises     General Comments        Pertinent Vitals/Pain Pain Assessment Pain Assessment: 0-10 Pain Score: 2  Pain Location: right knee Pain Descriptors / Indicators: Sore Pain Intervention(s): Repositioned, Monitored during session, Premedicated before session    Home Living                          Prior Function            PT Goals (current goals can now be found in the care plan section) Progress towards  PT goals: Progressing toward goals    Frequency    7X/week      PT Plan Current plan remains appropriate    Co-evaluation              AM-PAC PT "6 Clicks" Mobility   Outcome Measure  Help needed turning from your back to your side while in a flat bed without using bedrails?: None Help needed moving from lying on your back to sitting on the side of a flat bed without using bedrails?: A Little Help needed moving to and from a bed to a chair (including a wheelchair)?: A Little Help needed standing up from a chair using your arms (e.g., wheelchair or bedside chair)?: A Little Help needed to walk in hospital room?: A Little Help needed  climbing 3-5 steps with a railing? : A Little 6 Click Score: 19    End of Session Equipment Utilized During Treatment: Gait belt Activity Tolerance: Patient tolerated treatment well Patient left: with call bell/phone within reach;with family/visitor present;in bed Nurse Communication: Mobility status PT Visit Diagnosis: Difficulty in walking, not elsewhere classified (R26.2)     Time: 4696-2952 PT Time Calculation (min) (ACUTE ONLY): 15 min  Charges:  $Gait Training: 8-22 mins                    Paulino Door, DPT Physical Therapist Acute Rehabilitation Services Preferred contact method: Secure Chat Weekend Pager Only: [phone] Office: [phone]    Virginia Fischer 07/15/2022, 3:37 PM

## 2022-07-15 NOTE — Plan of Care (Signed)
°  Problem: Clinical Measurements: °Goal: Respiratory complications will improve °Outcome: Progressing °  °Problem: Clinical Measurements: °Goal: Cardiovascular complication will be avoided °Outcome: Progressing °  °Problem: Activity: °Goal: Risk for activity intolerance will decrease °Outcome: Progressing °  °Problem: Pain Managment: °Goal: General experience of comfort will improve °Outcome: Progressing °  °

## 2022-07-16 DIAGNOSIS — M1711 Unilateral primary osteoarthritis, right knee: Secondary | ICD-10-CM | POA: Diagnosis not present

## 2022-07-16 NOTE — TOC Transition Note (Signed)
Transition of Care [facility]) - CM/SW Discharge Note   Patient Details  Name: Virginia Fischer MRN: [id number] Date of Birth: [birth year]  [facility] CM/SW Contact:  Darleene Cleaver, LCSW Phone Number: 07/16/2022, 10:33 AM   Clinical Narrative:     CSW informed that patient now needs a 3 in 1 and a front wheel rolling walker, CSW contacted [employer] and they will deliver equipment to patient before she discharges.  Final next level of care: Home w Home Health Services Barriers to Discharge: No Barriers Identified   Patient Goals and CMS Choice Patient states their goals for this hospitalization and ongoing recovery are:: return home      Discharge Placement                       Discharge Plan and Services                DME Arranged: N/A DME Agency: NA       HH Arranged: PT HH Agency: [facility]        Social Determinants of Health (SDOH) Interventions     Readmission Risk Interventions     No data to display

## 2022-07-16 NOTE — Plan of Care (Signed)
Pt stable at this time. Pt to d/c home with family. Pt dressing clean, dry, and intact.  

## 2022-07-16 NOTE — Plan of Care (Signed)
?  Problem: Clinical Measurements: ?Goal: Diagnostic test results will improve ?Outcome: Progressing ?  ?Problem: Clinical Measurements: ?Goal: Respiratory complications will improve ?Outcome: Progressing ?  ?Problem: Clinical Measurements: ?Goal: Cardiovascular complication will be avoided ?Outcome: Progressing ?  ?Problem: Activity: ?Goal: Risk for activity intolerance will decrease ?Outcome: Progressing ?  ?Problem: Pain Managment: ?Goal: General experience of comfort will improve ?Outcome: Progressing ?  ?

## 2022-07-16 NOTE — Plan of Care (Signed)
  Problem: Coping: Goal: Level of anxiety will decrease Outcome: Progressing   Problem: Pain Managment: Goal: General experience of comfort will improve Outcome: Progressing   Problem: Safety: Goal: Ability to remain free from injury will improve Outcome: Progressing   

## 2022-07-16 NOTE — Progress Notes (Signed)
     Subjective: 2 Days Post-Op Procedure(s) (LRB): RIGHT TOTAL KNEE ARTHROPLASTY (Right) Awake, alert and oriented x 4. Dressing right knee intact with minimal scant spots dry blood.  She is doing well with PT  and OT.  Has right foot drop post op right TKR.  Patient reports pain as moderate.    Objective:   VITALS:  Temp:  [97.9 F (36.6 C)-99.3 F (37.4 C)] 99.3 F (37.4 C) (07/30 0554) Pulse Rate:  [89-98] 98 (07/30 0756) Resp:  [17-18] 18 (07/30 0554) BP: (102-161)/(63-79) 144/71 (07/30 0756) SpO2:  [88 %-100 %] 88 % (07/30 0756)  ABD soft Sensation intact distally Intact pulses distally Incision: dressing C/D/I and scant drainage Right foot DF weakness 1/5, advised to use a pillow under right knee to flex mildly and decrease pressure on the peroneal n.    LABS Recent Labs    07/15/22 0320  HGB 11.1*  WBC 13.8*  PLT 281   Recent Labs    07/15/22 0320  NA 139  K 4.1  CL 106  CO2 24  BUN 15  CREATININE 0.66  GLUCOSE 141*   No results for input(s): "LABPT", "INR" in the last 72 hours.   Assessment/Plan: 2 Days Post-Op Procedure(s) (LRB): RIGHT TOTAL KNEE ARTHROPLASTY (Right) Right foot drop, due to peroneal palsy, likely this is transient and will improve over next 3-4 months.  Advance diet Up with therapy D/C IV fluids Discharge home with home health  Vira Browns 07/16/2022, 10:45 AM Patient ID: Saddie Benders, female   DOB: 1956-10-17, 66 y.o.   MRN: [id number]

## 2022-07-16 NOTE — Discharge Summary (Signed)
Patient ID: Virginia Fischer MRN: [id number] DOB/AGE: 01/14/1956 66 y.o.  Admit date: 07/14/2022 Discharge date: 07/15/2022  Admission Diagnoses:  Principal Problem:   Unilateral primary osteoarthritis, right knee Active Problems:   Status post total right knee replacement   Discharge Diagnoses:  Same  Past Medical History:  Diagnosis Date   Arthritis     Surgeries: Procedure(s): RIGHT TOTAL KNEE ARTHROPLASTY on 07/14/2022   Consultants:   Discharged Condition: Improved  Hospital Course: Virginia Fischer is an 66 y.o. female who was admitted 07/14/2022 for operative treatment ofUnilateral primary osteoarthritis, right knee. Patient has severe unremitting pain that affects sleep, daily activities, and work/hobbies. After pre-op clearance the patient was taken to the operating room on 07/14/2022 and underwent  Procedure(s): RIGHT TOTAL KNEE ARTHROPLASTY.    Patient was given perioperative antibiotics:  Anti-infectives (From admission, onward)    Start     Dose/Rate Route Frequency Ordered Stop   07/14/22 1400  ceFAZolin (ANCEF) IVPB 1 g/50 mL premix        1 g 100 mL/hr over 30 Minutes Intravenous Every 6 hours 07/14/22 1206 07/14/22 2158   07/14/22 0600  ceFAZolin (ANCEF) IVPB 2g/100 mL premix        2 g 200 mL/hr over 30 Minutes Intravenous On call to O.R. 07/14/22 2778 07/14/22 2423        Patient was given sequential compression devices, early ambulation, and chemoprophylaxis to prevent DVT.  Patient benefited maximally from hospital stay and there were no complications.    Recent vital signs: Patient Vitals for the past 24 hrs:  BP Temp Temp src Pulse Resp SpO2  07/16/22 0756 (!) 144/71 -- -- 98 -- (!) 88 %  07/16/22 0554 (!) 151/68 99.3 F (37.4 C) Oral 96 18 94 %  07/15/22 2221 (!) 161/79 99.2 F (37.3 C) Oral 93 18 98 %  07/15/22 1428 (!) 143/69 98.6 F (37 C) Oral 89 17 99 %     Recent laboratory studies:  Recent Labs    07/15/22 0320  WBC 13.8*   HGB 11.1*  HCT 33.4*  PLT 281  NA 139  K 4.1  CL 106  CO2 24  BUN 15  CREATININE 0.66  GLUCOSE 141*  CALCIUM 8.8*     Discharge Medications:   Allergies as of 07/16/2022       Reactions   Codeine Other (See Comments)   Pt unsure of reaction         Medication List     STOP taking these medications    diclofenac Sodium 1 % Gel Commonly known as: VOLTAREN       TAKE these medications    acetaminophen 650 MG CR tablet Commonly known as: TYLENOL Take 650 mg by mouth every morning.   aspirin 81 MG chewable tablet Chew 1 tablet (81 mg total) by mouth 2 (two) times daily.   cholecalciferol 25 MCG (1000 UNIT) tablet Commonly known as: VITAMIN D3 Take 1,000 Units by mouth daily.   cyanocobalamin 1000 MCG tablet Commonly known as: VITAMIN B12 Take 1,000 mcg by mouth daily.   methocarbamol 500 MG tablet Commonly known as: ROBAXIN Take 1 tablet (500 mg total) by mouth every 6 (six) hours as needed for muscle spasms.   naproxen sodium 220 MG tablet Commonly known as: ALEVE Take 440 mg by mouth at bedtime.   oxyCODONE 5 MG immediate release tablet Commonly known as: Oxy IR/ROXICODONE Take 1-2 tablets (5-10 mg total) by mouth every 4 (four) hours  as needed for moderate pain (pain score 4-6).               Durable Medical Equipment  (From admission, onward)           Start     Ordered   07/14/22 1207  DME 3 n 1  Once        07/14/22 1206   07/14/22 1207  DME Walker rolling  Once       Question Answer Comment  Walker: With 5 Inch Wheels   Patient needs a walker to treat with the following condition Status post total right knee replacement      07/14/22 1206            Diagnostic Studies: DG Knee Right Port  Result Date: 07/14/2022 CLINICAL DATA:  Right knee surgery EXAM: PORTABLE RIGHT KNEE - 1-2 VIEW COMPARISON:  11/24/2021 FINDINGS: Interval postsurgical changes from right total knee arthroplasty. Arthroplasty components are in their  expected alignment. Small bony densities adjacent to the fibular head are unchanged from prior radiographs. No periprosthetic fracture or evidence of other complication. Expected postoperative changes within the overlying soft tissues. IMPRESSION: Interval postsurgical changes from right total knee arthroplasty without apparent postoperative complication. Electronically Signed   By: Duanne Guess D.O.   On: 07/14/2022 10:38    Disposition: Discharge disposition: 01-Home or Self Care       Discharge Instructions     Call MD / Call 911   Complete by: As directed    If you experience chest pain or shortness of breath, CALL 911 and be transported to the hospital emergency room.  If you develope a fever above 101 F, pus (white drainage) or increased drainage or redness at the wound, or calf pain, call your surgeon's office.   Constipation Prevention   Complete by: As directed    Drink plenty of fluids.  Prune juice may be helpful.  You may use a stool softener, such as Colace (over the counter) 100 mg twice a day.  Use MiraLax (over the counter) for constipation as needed.   Diet - low sodium heart healthy   Complete by: As directed    Discharge instructions   Complete by: As directed    NSTRUCTIONS AFTER JOINT REPLACEMENT   o Remove items at home which could result in a fall. This includes throw rugs or furniture in walking pathways o ICE to the affected joint every three hours while awake for 30 minutes at a time, for at least the first 3-5 days, and then as needed for pain and swelling.  Continue to use ice for pain and swelling. You may notice swelling that will progress down to the foot and ankle.  This is normal after surgery.  Elevate your leg when you are not up walking on it.   o Continue to use the breathing machine you got in the hospital (incentive spirometer) which will help keep your temperature down.  It is common for your temperature to cycle up and down following surgery,  especially at night when you are not up moving around and exerting yourself.  The breathing machine keeps your lungs expanded and your temperature down.   DIET:  As you were doing prior to hospitalization, we recommend a well-balanced diet.  DRESSING / WOUND CARE / SHOWERING  Keep the surgical dressing until follow up.  The dressing is water proof, so you can shower without any extra covering.  IF THE DRESSING FALLS OFF or the  wound gets wet inside, change the dressing with sterile gauze.  Please use good hand washing techniques before changing the dressing.  Do not use any lotions or creams on the incision until instructed by your surgeon.    ACTIVITY  o Increase activity slowly as tolerated, but follow the weight bearing instructions below.   o No driving for 6 weeks or until further direction given by your physician.  You cannot drive while taking narcotics.  o No lifting or carrying greater than 10 lbs. until further directed by your surgeon. o Avoid periods of inactivity such as sitting longer than an hour when not asleep. This helps prevent blood clots.  o You may return to work once you are authorized by your doctor.     WEIGHT BEARING   Weight bearing as tolerated with assist device (walker, cane, etc) as directed, use it as long as suggested by your surgeon or therapist, typically at least 4-6 weeks.   EXERCISES  Results after joint replacement surgery are often greatly improved when you follow the exercise, range of motion and muscle strengthening exercises prescribed by your doctor. Safety measures are also important to protect the joint from further injury. Any time any of these exercises cause you to have increased pain or swelling, decrease what you are doing until you are comfortable again and then slowly increase them. If you have problems or questions, call your caregiver or physical therapist for advice.   Rehabilitation is important following a joint replacement. After  just a few days of immobilization, the muscles of the leg can become weakened and shrink (atrophy).  These exercises are designed to build up the tone and strength of the thigh and leg muscles and to improve motion. Often times heat used for twenty to thirty minutes before working out will loosen up your tissues and help with improving the range of motion but do not use heat for the first two weeks following surgery (sometimes heat can increase post-operative swelling).   These exercises can be done on a training (exercise) mat, on the floor, on a table or on a bed. Use whatever works the best and is most comfortable for you.    Use music or television while you are exercising so that the exercises are a pleasant break in your day. This will make your life better with the exercises acting as a break in your routine that you can look forward to.   Perform all exercises about fifteen times, three times per day or as directed.  You should exercise both the operative leg and the other leg as well.  Exercises include:    Quad Sets - Tighten up the muscle on the front of the thigh (Quad) and hold for 5-10 seconds.    Straight Leg Raises - With your knee straight (if you were given a brace, keep it on), lift the leg to 60 degrees, hold for 3 seconds, and slowly lower the leg.  Perform this exercise against resistance later as your leg gets stronger.   Leg Slides: Lying on your back, slowly slide your foot toward your buttocks, bending your knee up off the floor (only go as far as is comfortable). Then slowly slide your foot back down until your leg is flat on the floor again.   Angel Wings: Lying on your back spread your legs to the side as far apart as you can without causing discomfort.   Hamstring Strength:  Lying on your back, push your heel against  the floor with your leg straight by tightening up the muscles of your buttocks.  Repeat, but this time bend your knee to a comfortable angle, and push your  heel against the floor.  You may put a pillow under the heel to make it more comfortable if necessary.   A rehabilitation program following joint replacement surgery can speed recovery and prevent re-injury in the future due to weakened muscles. Contact your doctor or a physical therapist for more information on knee rehabilitation.    CONSTIPATION  Constipation is defined medically as fewer than three stools per week and severe constipation as less than one stool per week.  Even if you have a regular bowel pattern at home, your normal regimen is likely to be disrupted due to multiple reasons following surgery.  Combination of anesthesia, postoperative narcotics, change in appetite and fluid intake all can affect your bowels.   YOU MUST use at least one of the following options; they are listed in order of increasing strength to get the job done.  They are all available over the counter, and you may need to use some, POSSIBLY even all of these options:    Drink plenty of fluids (prune juice may be helpful) and high fiber foods Colace 100 mg by mouth twice a day  Senokot for constipation as directed and as needed Dulcolax (bisacodyl), take with full glass of water  Miralax (polyethylene glycol) once or twice a day as needed.  If you have tried all these things and are unable to have a bowel movement in the first 3-4 days after surgery call either your surgeon or your primary doctor.    If you experience loose stools or diarrhea, hold the medications until you stool forms back up.  If your symptoms do not get better within 1 week or if they get worse, check with your doctor.  If you experience "the worst abdominal pain ever" or develop nausea or vomiting, please contact the office immediately for further recommendations for treatment.   ITCHING:  If you experience itching with your medications, try taking only a single pain pill, or even half a pain pill at a time.  You can also use Benadryl over  the counter for itching or also to help with sleep.   TED HOSE STOCKINGS:  Use stockings on both legs until for at least 2 weeks or as directed by physician office. They may be removed at night for sleeping.  MEDICATIONS:  See your medication summary on the "After Visit Summary" that nursing will review with you.  You may have some home medications which will be placed on hold until you complete the course of blood thinner medication.  It is important for you to complete the blood thinner medication as prescribed.  PRECAUTIONS:  If you experience chest pain or shortness of breath - call 911 immediately for transfer to the hospital emergency department.   If you develop a fever greater that 101 F, purulent drainage from wound, increased redness or drainage from wound, foul odor from the wound/dressing, or calf pain - CONTACT YOUR SURGEON.                                                   FOLLOW-UP APPOINTMENTS:  If you do not already have a post-op appointment, please call the office for an appointment  to be seen by your surgeon.  Guidelines for how soon to be seen are listed in your "After Visit Summary", but are typically between 1-4 weeks after surgery.  OTHER INSTRUCTIONS:   Knee Replacement:  Do not place pillow under knee, focus on keeping the knee straight while resting. CPM instructions: 0-90 degrees, 2 hours in the morning, 2 hours in the afternoon, and 2 hours in the evening. Place foam block, curve side up under heel at all times except when in CPM or when walking.  DO NOT modify, tear, cut, or change the foam block in any way.  POST-OPERATIVE OPIOID TAPER INSTRUCTIONS:  It is important to wean off of your opioid medication as soon as possible. If you do not need pain medication after your surgery it is ok to stop day one.  Opioids include:  o Codeine, Hydrocodone(Norco, Vicodin), Oxycodone(Percocet, oxycontin) and hydromorphone amongst others.   Long term and even short term use of  opiods can cause:  o Increased pain response  o Dependence  o Constipation  o Depression  o Respiratory depression  o And more.   Withdrawal symptoms can include  o Flu like symptoms  o Nausea, vomiting  o And more  Techniques to manage these symptoms  o Hydrate well  o Eat regular healthy meals  o Stay active  o Use relaxation techniques(deep breathing, meditating, yoga)  Do Not substitute Alcohol to help with tapering  If you have been on opioids for less than two weeks and do not have pain than it is ok to stop all together.   Plan to wean off of opioids  o This plan should start within one week post op of your joint replacement.  o Maintain the same interval or time between taking each dose and first decrease the dose.   o Cut the total daily intake of opioids by one tablet each day  o Next start to increase the time between doses.  o The last dose that should be eliminated is the evening dose.   MAKE SURE YOU:   Understand these instructions.   Get help right away if you are not doing well or get worse.    Thank you for letting us be a part of your medical care team.  It is a privilege we respect greatly.  We hope these instructions will help you stay on track for a fast and full recovery!     Dental Antibiotics:  In most cases prophylactic antibiotics for Dental procdeures after total joint surgery are not necessary.  Exceptions are as follows:  1. History of prior total joint infection  2. Severely immunocompromised (Organ Transplant, cancer chemotherapy, Rheumatoid biologic meds such as Humera)  3. Poorly controlled diabetes (A1C &gt; 8.0, blood glucose over 200)  If you have one of these conditions, contact your surgeon for an antibiotic prescription, prior to your dental procedure.  Recommend use a pillow to hold right knee slightly flexed when in bed. Ice to decrease swelling.   Increase activity slowly as tolerated   Complete by: As directed     Post-operative opioid taper instructions:   Complete by: As directed    POST-OPERATIVE OPIOID TAPER INSTRUCTIONS: It is important to wean off of your opioid medication as soon as possible. If you do not need pain medication after your surgery it is ok to stop day one. Opioids include: Codeine, Hydrocodone(Norco, Vicodin), Oxycodone(Percocet, oxycontin) and hydromorphone amongst others.  Long term and even short term use of opiods  can cause: Increased pain response Dependence Constipation Depression Respiratory depression And more.  Withdrawal symptoms can include Flu like symptoms Nausea, vomiting And more Techniques to manage these symptoms Hydrate well Eat regular healthy meals Stay active Use relaxation techniques(deep breathing, meditating, yoga) Do Not substitute Alcohol to help with tapering If you have been on opioids for less than two weeks and do not have pain than it is ok to stop all together.  Plan to wean off of opioids This plan should start within one week post op of your joint replacement. Maintain the same interval or time between taking each dose and first decrease the dose.  Cut the total daily intake of opioids by one tablet each day Next start to increase the time between doses. The last dose that should be eliminated is the evening dose.           Follow-up Information     [facility] Follow up.   Specialty: Home Health Services Why: to provide home physical therapy Contact information: [address] [place] [address] [phone]         Kathryne Hitch, MD Follow up in 2 week(s).   Specialty: Orthopedic Surgery Contact information: [address] [place] [address] [phone]                  Signed: Kathryne Hitch 07/16/2022, 1:53 PM

## 2022-07-16 NOTE — Progress Notes (Signed)
Pt needs a three in one and rolling walker to aid in her mobility and progression at home from knee replacement. These will aid her in recovery as her weakness and mobility are improving at home.

## 2022-07-16 NOTE — Progress Notes (Signed)
Physical Therapy Treatment Patient Details Name: Virginia Fischer MRN: [id number] DOB: 1956-08-15 Today's Date: 07/16/2022   History of Present Illness Pt is a 66yo female presenting s/p R-TKA on 07/14/22. No significant PMH.    PT Comments    Pt continues very concerned about post-op R foot drop but otherwise progressing well with mobility.  Pt up to ambulate increased distance in hall and performed therex program with assist.  Pt states feels much better about dc home this date.   Recommendations for follow up therapy are one component of a multi-disciplinary discharge planning process, led by the attending physician.  Recommendations may be updated based on patient status, additional functional criteria and insurance authorization.  Follow Up Recommendations  Follow physician's recommendations for discharge plan and follow up therapies     Assistance Recommended at Discharge Set up Supervision/Assistance  Patient can return home with the following A little help with walking and/or transfers;A little help with bathing/dressing/bathroom;Assistance with cooking/housework;Assist for transportation;Help with stairs or ramp for entrance   Equipment Recommendations  None recommended by PT    Recommendations for Other Services       Precautions / Restrictions Precautions Precautions: Fall;Knee Restrictions Weight Bearing Restrictions: No RLE Weight Bearing: Weight bearing as tolerated     Mobility  Bed Mobility Overal bed mobility: Needs Assistance Bed Mobility: Sit to Supine     Supine to sit: Supervision     General bed mobility comments: Pt self assisting R LE with L LE    Transfers Overall transfer level: Needs assistance Equipment used: Rolling walker (2 wheels) Transfers: Sit to/from Stand Sit to Stand: Min guard, Supervision           General transfer comment: verbal cues for hand placement    Ambulation/Gait Ambulation/Gait assistance: Min guard,  Supervision Gait Distance (Feet): 140 Feet Assistive device: Rolling walker (2 wheels) Gait Pattern/deviations: Step-to pattern, Step-through pattern, Decreased stance time - right, Decreased dorsiflexion - right       General Gait Details: verbal cues for sequence, step length, RW positioning; pt with decreased active DF on R   Stairs         General stair comments: Pt states comfortable with stairs from practice yesterday   Wheelchair Mobility    Modified Rankin (Stroke Patients Only)       Balance Overall balance assessment: Needs assistance Sitting-balance support: Feet supported, No upper extremity supported Sitting balance-Leahy Scale: Good     Standing balance support: During functional activity, Reliant on assistive device for balance, Bilateral upper extremity supported Standing balance-Leahy Scale: Poor                              Cognition Arousal/Alertness: Awake/alert Behavior During Therapy: WFL for tasks assessed/performed Overall Cognitive Status: Within Functional Limits for tasks assessed                                          Exercises Total Joint Exercises Ankle Circles/Pumps: AROM, 10 reps, Right Quad Sets: 10 reps, Both, AROM Heel Slides: AAROM, Right, 15 reps, Supine Hip ABduction/ADduction: AAROM, Right, 15 reps, Supine Straight Leg Raises: AAROM, Right, 10 reps    General Comments        Pertinent Vitals/Pain Pain Assessment Pain Assessment: 0-10 Pain Score: 4  Pain Location: right knee Pain Descriptors / Indicators: Sore  Pain Intervention(s): Limited activity within patient's tolerance, Monitored during session, Premedicated before session, Ice applied    Home Living                          Prior Function            PT Goals (current goals can now be found in the care plan section) Acute Rehab PT Goals Patient Stated Goal: to walk without pain PT Goal Formulation: With  patient Time For Goal Achievement: 07/21/22 Potential to Achieve Goals: Good Progress towards PT goals: Progressing toward goals    Frequency    7X/week      PT Plan Current plan remains appropriate    Co-evaluation              AM-PAC PT "6 Clicks" Mobility   Outcome Measure  Help needed turning from your back to your side while in a flat bed without using bedrails?: None Help needed moving from lying on your back to sitting on the side of a flat bed without using bedrails?: A Little Help needed moving to and from a bed to a chair (including a wheelchair)?: A Little Help needed standing up from a chair using your arms (e.g., wheelchair or bedside chair)?: A Little Help needed to walk in hospital room?: A Little Help needed climbing 3-5 steps with a railing? : A Little 6 Click Score: 19    End of Session Equipment Utilized During Treatment: Gait belt Activity Tolerance: Patient tolerated treatment well Patient left: with call bell/phone within reach;with family/visitor present;in bed Nurse Communication: Mobility status PT Visit Diagnosis: Difficulty in walking, not elsewhere classified (R26.2) Pain - Right/Left: Right Pain - part of body: Knee     Time: 2119-4174 PT Time Calculation (min) (ACUTE ONLY): 46 min  Charges:  $Gait Training: 8-22 mins $Therapeutic Exercise: 8-22 mins                     Mauro Kaufmann PT Acute Rehabilitation Services Pager [phone] Office [phone]    Evyn Putzier 07/16/2022, 11:23 AM

## 2022-07-16 NOTE — Progress Notes (Signed)
Pt received dme prior to d/c. Pt remains stable.

## 2022-07-17 ENCOUNTER — Telehealth: Payer: Self-pay

## 2022-07-17 ENCOUNTER — Encounter (HOSPITAL_COMMUNITY): Payer: Self-pay | Admitting: Orthopaedic Surgery

## 2022-07-17 NOTE — Telephone Encounter (Signed)
Nitka told her to elevate her leg with a pillow, she said nurses in hospital told her not to, she's confused

## 2022-07-17 NOTE — Telephone Encounter (Signed)
Patient calling asking about her foot drop, and her D/C says to take Aleve 440mg  at night?

## 2022-07-20 ENCOUNTER — Other Ambulatory Visit: Payer: Self-pay | Admitting: Orthopaedic Surgery

## 2022-07-20 ENCOUNTER — Telehealth: Payer: Self-pay | Admitting: Orthopaedic Surgery

## 2022-07-20 ENCOUNTER — Encounter: Payer: BC Managed Care – PPO | Admitting: Orthopaedic Surgery

## 2022-07-20 MED ORDER — OXYCODONE HCL 5 MG PO TABS: 5.0000 mg | ORAL_TABLET | ORAL | 0 refills | Status: DC | PRN

## 2022-07-20 NOTE — Telephone Encounter (Signed)
Patient's husband Onalee Hua called advised patient need Rx refilled Oxycodone. Patient uses [facility] [address].The  number to contact Onalee Hua is [phone]

## 2022-07-27 ENCOUNTER — Ambulatory Visit (INDEPENDENT_AMBULATORY_CARE_PROVIDER_SITE_OTHER): Payer: BC Managed Care – PPO | Admitting: Physician Assistant

## 2022-07-27 ENCOUNTER — Telehealth: Payer: Self-pay

## 2022-07-27 ENCOUNTER — Encounter: Payer: BC Managed Care – PPO | Admitting: Physician Assistant

## 2022-07-27 ENCOUNTER — Encounter: Payer: Self-pay | Admitting: Physician Assistant

## 2022-07-27 ENCOUNTER — Ambulatory Visit (HOSPITAL_COMMUNITY)
Admission: RE | Admit: 2022-07-27 | Discharge: 2022-07-27 | Disposition: A | Discharge status: Home / Self Care | Payer: BC Managed Care – PPO | Source: Ambulatory Visit | Attending: Physician Assistant | Admitting: Physician Assistant

## 2022-07-27 DIAGNOSIS — Z96651 Presence of right artificial knee joint: Secondary | ICD-10-CM | POA: Diagnosis present

## 2022-07-27 DIAGNOSIS — I824Z1 Acute embolism and thrombosis of unspecified deep veins of right distal lower extremity: Secondary | ICD-10-CM

## 2022-07-27 MED ORDER — RIVAROXABAN (XARELTO) VTE STARTER PACK (15 & 20 MG): ORAL_TABLET | ORAL | 0 refills | Status: DC

## 2022-07-27 MED ORDER — GABAPENTIN 300 MG PO CAPS: 300.0000 mg | ORAL_CAPSULE | Freq: Every day | ORAL | 1 refills | Status: DC

## 2022-07-27 MED ORDER — HYDROCODONE-ACETAMINOPHEN 5-325 MG PO TABS: 1.0000 | ORAL_TABLET | Freq: Four times a day (QID) | ORAL | 0 refills | Status: DC | PRN

## 2022-07-27 NOTE — Progress Notes (Signed)
HPI: [name] comes in today status post right total knee arthroplasty 07/14/2022.  She states she is having pain at the top of the foot is burning-like pain.  She also is having pain in her right calf region.  States her knee pain is 5-6 out of 10 pain at worst.  She unfortunately developed foot drop postop and has an AFO ordered but she has not picked this up yet.  She is currently on aspirin for DVT prophylaxis.  She is taking occasional oxycodone and Robaxin.  She is also taking Tylenol and naproxen.  Review of systems: See HPI otherwise negative  Physical exam: General well-developed well-nourished female in no acute distress.  Mood and affect appropriate. Respirations:  Normal respiratory effort. Right lower extremity: Slight edema compared to the nonoperative leg.  Tenderness left calf region.  She has considerable bruising posterior aspect of the right lower leg.  Surgical incisions well-approximated no signs of infection or dehiscence.  Full extension flexes to approximately 110 degrees.  Positive foot drop on the right.  Negative foot drop on the left.  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  Staples removed Steri-Strips applied.  Per her request we changed her to Norco from oxycodone and she will stop the oxycodone.  We also placed her on Neurontin secondary to the burning pain she is having right lower leg.  She will take vitamin B6 100 mg twice daily.  Begin wearing her AFO once available.  She will transition to outpatient therapy for range of motion and strengthening home exercise program for her knee.  She is able to get the incision wet in the shower.  Will obtain a Doppler of her right lower leg to rule out DVT.  Addendum: Preliminary results showed deep venous thrombosis involving the right posterior tibial vein sand right peroneal veins.  Cystic structure is seen in the popliteal  fossa. Patient was informed that Xarelto would be called out for her.  She will stop all NSAIDs and stop the aspirin at this point.  Will see her back at her regularly scheduled appointment.

## 2022-07-27 NOTE — Telephone Encounter (Signed)
Vascular called and stated pt was positive for a DVT. Virginia Fischer called in Xarelto starter pack. LVM for pt advising

## 2022-07-27 NOTE — Progress Notes (Signed)
Lower extremity venous has been completed.   Preliminary results in CV Proc.   Virginia Fischer Meosha Castanon 07/27/2022 11:19 AM

## 2022-07-31 ENCOUNTER — Ambulatory Visit: Payer: BC Managed Care – PPO | Admitting: Physical Therapy

## 2022-08-02 ENCOUNTER — Other Ambulatory Visit: Payer: Self-pay

## 2022-08-02 ENCOUNTER — Encounter (HOSPITAL_BASED_OUTPATIENT_CLINIC_OR_DEPARTMENT_OTHER): Payer: Self-pay | Admitting: Physical Therapy

## 2022-08-02 ENCOUNTER — Ambulatory Visit (HOSPITAL_BASED_OUTPATIENT_CLINIC_OR_DEPARTMENT_OTHER): Payer: BC Managed Care – PPO | Attending: Physician Assistant | Admitting: Physical Therapy

## 2022-08-02 DIAGNOSIS — M25661 Stiffness of right knee, not elsewhere classified: Secondary | ICD-10-CM | POA: Insufficient documentation

## 2022-08-02 DIAGNOSIS — R2689 Other abnormalities of gait and mobility: Secondary | ICD-10-CM | POA: Diagnosis not present

## 2022-08-02 DIAGNOSIS — Z96651 Presence of right artificial knee joint: Secondary | ICD-10-CM | POA: Diagnosis not present

## 2022-08-02 DIAGNOSIS — M6281 Muscle weakness (generalized): Secondary | ICD-10-CM | POA: Diagnosis not present

## 2022-08-02 DIAGNOSIS — R600 Localized edema: Secondary | ICD-10-CM | POA: Insufficient documentation

## 2022-08-02 DIAGNOSIS — M25561 Pain in right knee: Secondary | ICD-10-CM | POA: Insufficient documentation

## 2022-08-02 NOTE — Therapy (Signed)
OUTPATIENT PHYSICAL THERAPY LOWER EXTREMITY EVALUATION   Patient Name: Virginia Fischer MRN: [id number] DOB:1956-09-19, 66 y.o., female Today's Date: 08/02/2022   PT End of Session - 08/02/22 1151     Visit Number 1    Number of Visits 16    Date for PT Re-Evaluation 09/27/22    PT Start Time 0933    PT Stop Time 1015    PT Time Calculation (min) 42 min    Activity Tolerance Patient tolerated treatment well    Behavior During Therapy [facility] for tasks assessed/performed             Past Medical History:  Diagnosis Date   Arthritis    Past Surgical History:  Procedure Laterality Date   CESAREAN SECTION  1995   TOTAL KNEE ARTHROPLASTY Right 07/14/2022   Procedure: RIGHT TOTAL KNEE ARTHROPLASTY;  Surgeon: Kathryne Hitch, MD;  Location: [facility];  Service: Orthopedics;  Laterality: Right;   Patient Active Problem List   Diagnosis Date Noted   Status post total right knee replacement 07/14/2022   Unilateral primary osteoarthritis, right knee 07/13/2022    PCP: Dr Jarrett Soho   REFERRING PROVIDER: Dr Doneen Poisson  REFERRING DIAG: R TKA   THERAPY DIAG:  Acute pain of right knee  Other abnormalities of gait and mobility  Stiffness of right knee, not elsewhere classified  Muscle weakness (generalized)  Localized edema  Rationale for Evaluation and Treatment Rehabilitation  ONSET DATE: 7/28  SUBJECTIVE:   SUBJECTIVE STATEMENT: Patient had a TKA on 07/14/2022. She has been having home health. She has developed foot drop since the surgery as well as a DVT. She has been on Xeralto for 5 days. The footdrop hasn't improved although she is having more sensation.  She is using a cane.   PERTINENT HISTORY: Right Knee OA  PAIN:  Are you having pain? Yes: NPRS scale: 7/10 at worst 3-4 Pain location: lateral knee now  Pain description: aching  Aggravating factors: standing, walking, morning, Relieving factors: gabapentin, Hydrocodone every 6 hours    PRECAUTIONS: None  WEIGHT BEARING RESTRICTIONS No  FALLS:  Has patient fallen in last 6 months? 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   EDEMA:  Circumferential: 56.7 46.3   POSTURE: No Significant postural limitations  PALPATION: No unexpected tenderness to palpation   LOWER EXTREMITY ROM:  Active ROM Right eval Left eval  Hip flexion    Hip extension    Hip abduction    Hip adduction    Hip internal rotation    Hip external rotation    Knee flexion 103   Knee extension -6   Ankle dorsiflexion    Ankle plantarflexion    Ankle inversion    Ankle eversion     (Blank rows = not tested)  Passive ROM Right eval Left eval  Hip flexion    Hip extension    Hip abduction    Hip adduction    Hip internal rotation    Hip external rotation    Knee flexion 110   Knee extension -5  Ankle dorsiflexion    Ankle plantarflexion    Ankle inversion    Ankle eversion     (Blank rows = not tested)  LOWER EXTREMITY MMT:  MMT Right eval Left eval  Hip flexion 4+ 5  Hip extension    Hip abduction 4 5  Hip adduction    Hip internal rotation    Hip external rotation    Knee flexion    Knee extension 3+ 5  Ankle dorsiflexion 1 5  Ankle plantarflexion    Ankle inversion    Ankle eversion     (Blank rows = not tested)  FUNCTIONAL TESTS:  Mild use of hands for sit to stand   GAIT: Significant foot drop with ambulation    TODAY'S TREATMENT: Reviewed HEP that she is doing already    PATIENT EDUCATION:  Education details: HEP, symptom management; progression of activity  Person  educated: Patient and Spouse Education method: Explanation, Demonstration, Tactile cues, Verbal cues, and Handouts Education comprehension: verbalized understanding, returned demonstration, verbal cues required, tactile cues required, and needs further education   HOME EXERCISE PROGRAM: Reviewed current HEP. Patient educated on exercises to emphasize including AP's quad sets; lr and ex/flex stretching   ASSESSMENT:  CLINICAL IMPRESSION: Patient is a 66 y.o. female who was seen today for physical therapy evaluation and treatment for Right TKA. She presents with expected limitations in function, motion, and strength of her knee. She does have foot drop right now.    OBJECTIVE IMPAIRMENTS Abnormal gait, decreased activity tolerance, decreased endurance, decreased mobility, difficulty walking, decreased ROM, decreased strength, and pain.   ACTIVITY LIMITATIONS bending, standing, squatting, sleeping, stairs, transfers, bed mobility, dressing, and locomotion level  PARTICIPATION LIMITATIONS: meal prep, cleaning, laundry, driving, occupation, yard work, and church  PERSONAL FACTORS Time since onset of injury/illness/exacerbation are also affecting patient's functional outcome.   REHAB POTENTIAL: Excellent  CLINICAL DECISION MAKING: Evolving/moderate complexity moderate complexity 2nd to foot drop   EVALUATION COMPLEXITY: Low   GOALS: Goals reviewed with patient? Yes  SHORT TERM GOALS: Target date: 08/23/2022  Patient will increase right knee flexion to 120 degrees  Baseline: Goal status: INITIAL  2.  Patient will demonstrate 3/5 right ankle DF strength  Baseline:  Goal status: INITIAL  3.  Patient will demonstrate full knee extension  Baseline:  Goal status: INITIAL   LONG TERM GOALS: Target date: 09/13/2022   Patient will walk community distances without pain in order to return to her job  Baseline:  Goal status: INITIAL  2.  Patient will go up/down 6 steps without pain   Baseline:  Goal status: INITIAL  3.  Patient will be independent with complete HEP  Baseline:    PLAN: PT FREQUENCY: 2x/week  PT DURATION: 8 weeks  PLANNED INTERVENTIONS: Therapeutic exercises, Therapeutic activity, Neuromuscular re-education, Balance training, Gait training, Patient/Family education, Self Care, Joint mobilization, Stair training, DME instructions, Aquatic Therapy, Dry Needling, Electrical stimulation, Cryotherapy, Moist heat, Traction, Ultrasound, and Manual therapy  PLAN FOR NEXT SESSION: Russian to anterior tib; knee ROM; SLR; TKE; heel raise; standing march; begin stair training when able.    Dessie Coma, PT 08/02/2022, 11:54 AM

## 2022-08-03 ENCOUNTER — Encounter (HOSPITAL_BASED_OUTPATIENT_CLINIC_OR_DEPARTMENT_OTHER): Payer: Self-pay | Admitting: Physical Therapy

## 2022-08-04 ENCOUNTER — Ambulatory Visit (HOSPITAL_BASED_OUTPATIENT_CLINIC_OR_DEPARTMENT_OTHER): Payer: BC Managed Care – PPO | Admitting: Physical Therapy

## 2022-08-04 ENCOUNTER — Encounter (HOSPITAL_BASED_OUTPATIENT_CLINIC_OR_DEPARTMENT_OTHER): Payer: Self-pay | Admitting: Physical Therapy

## 2022-08-04 DIAGNOSIS — M25661 Stiffness of right knee, not elsewhere classified: Secondary | ICD-10-CM

## 2022-08-04 DIAGNOSIS — R600 Localized edema: Secondary | ICD-10-CM

## 2022-08-04 DIAGNOSIS — R2689 Other abnormalities of gait and mobility: Secondary | ICD-10-CM

## 2022-08-04 DIAGNOSIS — M6281 Muscle weakness (generalized): Secondary | ICD-10-CM

## 2022-08-04 DIAGNOSIS — M25561 Pain in right knee: Secondary | ICD-10-CM

## 2022-08-04 NOTE — Therapy (Signed)
OUTPATIENT PHYSICAL THERAPY LOWER EXTREMITY EVALUATION   Patient Name: Virginia Fischer MRN: [id number] DOB:Feb 22, 1956, 66 y.o., female Today's Date: 08/04/2022   PT End of Session - 08/04/22 0830     Visit Number 2    Number of Visits 16    Date for PT Re-Evaluation 09/27/22    PT Start Time 0800    PT Stop Time 0843    PT Time Calculation (min) 43 min    Activity Tolerance Patient tolerated treatment well    Behavior During Therapy [facility] for tasks assessed/performed             Past Medical History:  Diagnosis Date   Arthritis    Past Surgical History:  Procedure Laterality Date   CESAREAN SECTION  1995   TOTAL KNEE ARTHROPLASTY Right 07/14/2022   Procedure: RIGHT TOTAL KNEE ARTHROPLASTY;  Surgeon: Kathryne Hitch, MD;  Location: [facility];  Service: Orthopedics;  Laterality: Right;   Patient Active Problem List   Diagnosis Date Noted   Status post total right knee replacement 07/14/2022   Unilateral primary osteoarthritis, right knee 07/13/2022    PCP: Dr Jarrett Soho   REFERRING PROVIDER: Dr Doneen Poisson  REFERRING DIAG: R TKA   THERAPY DIAG:  Acute pain of right knee  Other abnormalities of gait and mobility  Stiffness of right knee, not elsewhere classified  Muscle weakness (generalized)  Localized edema  Rationale for Evaluation and Treatment Rehabilitation  ONSET DATE: 7/28  SUBJECTIVE:   SUBJECTIVE STATEMENT: The patient has been a little more sore the past few days. She feels like she may be doing more. No significant change with her foot drop.  PERTINENT HISTORY: Right Knee OA  PAIN:  Are you having pain? Yes: NPRS scale: 7/10 at worst 3-4 Pain location: lateral knee now  Pain description: aching  Aggravating factors: standing, walking, morning, Relieving factors: gabapentin, Hydrocodone every 6 hours   PRECAUTIONS: None  WEIGHT BEARING RESTRICTIONS No  FALLS:  Has patient fallen in last 6 months? 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   EDEMA:  Circumferential: 56.7 46.3   POSTURE: No Significant postural limitations  PALPATION: No unexpected tenderness to palpation   LOWER EXTREMITY ROM:  Active ROM Right eval Left eval  Hip flexion    Hip extension    Hip abduction    Hip adduction    Hip internal rotation    Hip external rotation    Knee flexion 103   Knee extension -6   Ankle dorsiflexion    Ankle plantarflexion    Ankle inversion    Ankle eversion     (Blank rows = not tested)  Passive ROM Right eval Left eval  Hip flexion    Hip extension    Hip abduction    Hip adduction    Hip internal rotation    Hip external rotation    Knee flexion 110   Knee extension -5   Ankle dorsiflexion    Ankle plantarflexion    Ankle inversion    Ankle eversion     (  Blank rows = not tested)  LOWER EXTREMITY MMT:  MMT Right eval Left eval  Hip flexion 4+ 5  Hip extension    Hip abduction 4 5  Hip adduction    Hip internal rotation    Hip external rotation    Knee flexion    Knee extension 3+ 5  Ankle dorsiflexion 1 5  Ankle plantarflexion    Ankle inversion    Ankle eversion     (Blank rows = not tested)  FUNCTIONAL TESTS:  Mild use of hands for sit to stand   GAIT: Significant foot drop with ambulation    TODAY'S TREATMENT:  8/18 Russian L27 with contraction. Could feel the stim despite decreased sensation 10/10 with cuing to fire while it was ramped up  Noticeable DF activation   Manual PROM: into flexion and extension   Quad set 2x10  SAQ 3x10  SLR 2x10   Standing  weight shift fwd and back  Standing march 2x5 with cuing to keep contralateral hip stable.   Reviewed HEP that she is doing already     PATIENT EDUCATION:  Education details: HEP, symptom management; progression of activity  Person educated: Patient and Spouse Education method: Explanation, Demonstration, Tactile cues, Verbal cues, and Handouts Education comprehension: verbalized understanding, returned demonstration, verbal cues required, tactile cues required, and needs further education   HOME EXERCISE PROGRAM: Reviewed current HEP. Patient educated on exercises to emphasize including AP's quad sets; lr and ex/flex stretching   ASSESSMENT:  CLINICAL IMPRESSION: Therapy trialed [place] to her anterior tib. She was able to activate DF with a little compensation, but she was able to do it. We did 5 min to start but will advance. Therapy cued her to DF when the machine was active. Therapy performed manual therapy for the patients knee. Her knee range is progressing well. We added in standing weight shifts and marching. With marching she was only able to do 5 reps without compensation. She will practice at home.    OBJECTIVE IMPAIRMENTS Abnormal gait, decreased activity tolerance, decreased endurance, decreased mobility, difficulty walking, decreased ROM, decreased strength, and pain.   ACTIVITY LIMITATIONS bending, standing, squatting, sleeping, stairs, transfers, bed mobility, dressing, and locomotion level  PARTICIPATION LIMITATIONS: meal prep, cleaning, laundry, driving, occupation, yard work, and church  PERSONAL FACTORS Time since onset of injury/illness/exacerbation are also affecting patient's functional outcome.   REHAB POTENTIAL: Excellent  CLINICAL DECISION MAKING: Evolving/moderate complexity moderate complexity 2nd to foot drop   EVALUATION COMPLEXITY: Low   GOALS: Goals reviewed with patient? Yes  SHORT TERM GOALS: Target date: 08/23/2022  Patient will increase  right knee flexion to 120 degrees  Baseline: Goal status: INITIAL  2.  Patient will demonstrate 3/5 right ankle DF strength  Baseline:  Goal status: INITIAL  3.  Patient will demonstrate full knee extension  Baseline:  Goal status: INITIAL   LONG TERM GOALS: Target date: 09/13/2022   Patient will walk community distances without pain in order to return to her job  Baseline:  Goal status: INITIAL  2.  Patient will go up/down 6 steps without pain  Baseline:  Goal status: INITIAL  3.  Patient will be independent with complete HEP  Baseline:    PLAN: PT FREQUENCY: 2x/week  PT DURATION: 8 weeks  PLANNED INTERVENTIONS: Therapeutic exercises, Therapeutic activity, Neuromuscular re-education, Balance training, Gait training, Patient/Family education, Self Care, Joint mobilization, Stair training, DME instructions, Aquatic Therapy, Dry Needling, Electrical stimulation, Cryotherapy, Moist heat, Traction, Ultrasound, and Manual therapy  PLAN FOR NEXT SESSION: Russian to anterior tib; knee ROM; SLR; TKE; heel raise; standing march; begin stair training when able.    Carney Living, PT 08/04/2022, 11:03 AM

## 2022-08-07 ENCOUNTER — Other Ambulatory Visit: Payer: Self-pay | Admitting: Physician Assistant

## 2022-08-07 MED ORDER — HYDROCODONE-ACETAMINOPHEN 5-325 MG PO TABS: 1.0000 | ORAL_TABLET | Freq: Four times a day (QID) | ORAL | 0 refills | Status: DC | PRN

## 2022-08-08 ENCOUNTER — Ambulatory Visit (HOSPITAL_BASED_OUTPATIENT_CLINIC_OR_DEPARTMENT_OTHER): Payer: BC Managed Care – PPO | Admitting: Physical Therapy

## 2022-08-08 ENCOUNTER — Encounter (HOSPITAL_BASED_OUTPATIENT_CLINIC_OR_DEPARTMENT_OTHER): Payer: Self-pay | Admitting: Physical Therapy

## 2022-08-08 DIAGNOSIS — R600 Localized edema: Secondary | ICD-10-CM

## 2022-08-08 DIAGNOSIS — M6281 Muscle weakness (generalized): Secondary | ICD-10-CM

## 2022-08-08 DIAGNOSIS — M25561 Pain in right knee: Secondary | ICD-10-CM | POA: Diagnosis not present

## 2022-08-08 DIAGNOSIS — R2689 Other abnormalities of gait and mobility: Secondary | ICD-10-CM

## 2022-08-08 DIAGNOSIS — M25661 Stiffness of right knee, not elsewhere classified: Secondary | ICD-10-CM

## 2022-08-08 NOTE — Therapy (Signed)
OUTPATIENT PHYSICAL THERAPY LOWER EXTREMITY TREATMENT   Patient Name: Virginia Fischer MRN: [id number] DOB:August 13, 1956, 66 y.o., female Today's Date: 08/08/2022   PT End of Session - 08/08/22 0824     Visit Number 3    Number of Visits 16    Date for PT Re-Evaluation 09/27/22    PT Start Time 0802    PT Stop Time 0840    PT Time Calculation (min) 38 min    Activity Tolerance Patient tolerated treatment well    Behavior During Therapy [facility] for tasks assessed/performed              Past Medical History:  Diagnosis Date   Arthritis    Past Surgical History:  Procedure Laterality Date   CESAREAN SECTION  1995   TOTAL KNEE ARTHROPLASTY Right 07/14/2022   Procedure: RIGHT TOTAL KNEE ARTHROPLASTY;  Surgeon: Kathryne Hitch, MD;  Location: [facility];  Service: Orthopedics;  Laterality: Right;   Patient Active Problem List   Diagnosis Date Noted   Status post total right knee replacement 07/14/2022   Unilateral primary osteoarthritis, right knee 07/13/2022    PCP: Dr Jarrett Soho   REFERRING PROVIDER: Dr Doneen Poisson  REFERRING DIAG: R TKA   THERAPY DIAG:  Acute pain of right knee  Other abnormalities of gait and mobility  Stiffness of right knee, not elsewhere classified  Muscle weakness (generalized)  Localized edema  Rationale for Evaluation and Treatment Rehabilitation  ONSET DATE: 7/28  SUBJECTIVE:   SUBJECTIVE STATEMENT: Pt states that she has had no change in her foot drop. The knee is doing well but she does notice that she has less numbness down the front of the leg.   PERTINENT HISTORY: Right Knee OA  PAIN:  Are you having pain? Yes: NPRS scale: 4/10 Pain location: lateral knee now  Pain description: aching  Aggravating factors: standing, walking, morning, Relieving factors: gabapentin, Hydrocodone every 6 hours   PRECAUTIONS: None  WEIGHT BEARING RESTRICTIONS No  FALLS:  Has patient fallen in last 6 months?  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   EDEMA:  Circumferential: 56.7 46.3   POSTURE: No Significant postural limitations  PALPATION: No unexpected tenderness to palpation   LOWER EXTREMITY ROM:  Active ROM Right eval Left eval  Hip flexion    Hip extension    Hip abduction    Hip adduction    Hip internal rotation    Hip external rotation    Knee flexion 103   Knee extension -6   Ankle dorsiflexion    Ankle plantarflexion    Ankle inversion    Ankle eversion     (Blank rows = not tested)  Passive ROM Right eval Left eval  Hip flexion    Hip extension    Hip abduction    Hip adduction    Hip internal rotation    Hip external rotation    Knee flexion 110   Knee extension -5   Ankle dorsiflexion    Ankle plantarflexion    Ankle inversion    Ankle eversion     (  Blank rows = not tested)  LOWER EXTREMITY MMT:  MMT Right eval Left eval  Hip flexion 4+ 5  Hip extension    Hip abduction 4 5  Hip adduction    Hip internal rotation    Hip external rotation    Knee flexion    Knee extension 3+ 5  Ankle dorsiflexion 1 5  Ankle plantarflexion    Ankle inversion    Ankle eversion     (Blank rows = not tested)  FUNCTIONAL TESTS:  Mild use of hands for sit to stand   GAIT: Significant foot drop with ambulation    TODAY'S TREATMENT:  8/22  Russian E-stim seated ankle DF, started in standing and transitioned to seated due to fatigue 62 mA, 4/12 cycle, 80 bps freq, 5s ramp, pad along length of Tib ant and fibularis group Trace contraction noted, 10 mins  Standing HR 2x10  with UE support Seated LAQ 2x10 Standing NBOS on Airex for ankle stability 30s 4x 2 Standing TKE 10x     8/18 Russian L27 with contraction. Could feel the stim despite decreased sensation 10/10 with cuing to fire while it was ramped up  Noticeable DF activation   Manual PROM: into flexion and extension   Quad set 2x10  SAQ 3x10  SLR 2x10   Standing weight shift fwd and back  Standing march 2x5 with cuing to keep contralateral hip stable.   Reviewed HEP that she is doing already     PATIENT EDUCATION:  Education details: HEP, symptom management; progression of activity  Person educated: Patient and Spouse Education method: Explanation, Demonstration, Tactile cues, Verbal cues, and Handouts Education comprehension: verbalized understanding, returned demonstration, verbal cues required, tactile cues required, and needs further education   HOME EXERCISE PROGRAM: Reviewed current HEP. Patient educated on exercises to emphasize including AP's quad sets; lr and ex/flex stretching   ASSESSMENT:  CLINICAL IMPRESSION: Pt with trace contraction with [place] e-stim to tib ant and fibularis group. Pt unable to fully clear floor at this time but able to introduce standing exercise to facilitate isometric ankle stabilizer contractions. Pt HEP updated at this time. Pt still with good knee extension and flexion for this phase of healing post op. Plan to continue with E-stim for ankle DF and continue with knee ROM and strength as tolerated. Pt would benefit from continued skilled therapy in order to reach goals and maximize functional R LE strength and ROM for full return to PLOF.    OBJECTIVE IMPAIRMENTS Abnormal gait, decreased activity tolerance, decreased endurance, decreased mobility, difficulty walking, decreased ROM, decreased strength, and pain.   ACTIVITY LIMITATIONS bending, standing, squatting, sleeping, stairs, transfers, bed mobility, dressing, and locomotion  level  PARTICIPATION LIMITATIONS: meal prep, cleaning, laundry, driving, occupation, yard work, and church  PERSONAL FACTORS Time since onset of injury/illness/exacerbation are also affecting patient's functional outcome.   REHAB POTENTIAL: Excellent  CLINICAL DECISION MAKING: Evolving/moderate complexity moderate complexity 2nd to foot drop   EVALUATION COMPLEXITY: Low   GOALS: Goals reviewed with patient? Yes  SHORT TERM GOALS: Target date: 08/23/2022  Patient will increase right knee flexion to 120 degrees  Baseline: Goal status: INITIAL  2.  Patient will demonstrate 3/5 right ankle DF strength  Baseline:  Goal status: INITIAL  3.  Patient will demonstrate full knee extension  Baseline:  Goal status: INITIAL   LONG TERM GOALS: Target date: 09/13/2022   Patient will walk community distances without pain in order to return to her job  Baseline:  Goal status: INITIAL  2.  Patient will go up/down 6 steps without pain  Baseline:  Goal status: INITIAL  3.  Patient will be independent with complete HEP  Baseline:    PLAN: PT FREQUENCY: 2x/week  PT DURATION: 8 weeks  PLANNED INTERVENTIONS: Therapeutic exercises, Therapeutic activity, Neuromuscular re-education, Balance training, Gait training, Patient/Family education, Self Care, Joint mobilization, Stair training, DME instructions, Aquatic Therapy, Dry Needling, Electrical stimulation, Cryotherapy, Moist heat, Traction, Ultrasound, and Manual therapy  PLAN FOR NEXT SESSION: Russian to anterior tib; knee ROM; SLR; TKE; heel raise; standing march; begin stair training when able.    Zebedee Iba, PT 08/08/2022, 9:20 AM

## 2022-08-10 ENCOUNTER — Encounter (HOSPITAL_BASED_OUTPATIENT_CLINIC_OR_DEPARTMENT_OTHER): Payer: Self-pay | Admitting: Physical Therapy

## 2022-08-10 ENCOUNTER — Ambulatory Visit (HOSPITAL_BASED_OUTPATIENT_CLINIC_OR_DEPARTMENT_OTHER): Payer: BC Managed Care – PPO | Admitting: Physical Therapy

## 2022-08-10 DIAGNOSIS — M25661 Stiffness of right knee, not elsewhere classified: Secondary | ICD-10-CM

## 2022-08-10 DIAGNOSIS — M25561 Pain in right knee: Secondary | ICD-10-CM

## 2022-08-10 DIAGNOSIS — M6281 Muscle weakness (generalized): Secondary | ICD-10-CM

## 2022-08-10 DIAGNOSIS — R600 Localized edema: Secondary | ICD-10-CM

## 2022-08-10 DIAGNOSIS — R2689 Other abnormalities of gait and mobility: Secondary | ICD-10-CM

## 2022-08-10 NOTE — Therapy (Signed)
OUTPATIENT PHYSICAL THERAPY LOWER EXTREMITY TREATMENT   Patient Name: Virginia Fischer MRN: [id number] DOB:1956-05-09, 66 y.o., female Today's Date: 08/10/2022   PT End of Session - 08/10/22 1237     Visit Number 4    Number of Visits 16    Date for PT Re-Evaluation 09/27/22    PT Start Time 0930    PT Stop Time 1012    PT Time Calculation (min) 42 min    Activity Tolerance Patient tolerated treatment well    Behavior During Therapy [facility] for tasks assessed/performed              Past Medical History:  Diagnosis Date   Arthritis    Past Surgical History:  Procedure Laterality Date   CESAREAN SECTION  1995   TOTAL KNEE ARTHROPLASTY Right 07/14/2022   Procedure: RIGHT TOTAL KNEE ARTHROPLASTY;  Surgeon: Kathryne Hitch, MD;  Location: [facility];  Service: Orthopedics;  Laterality: Right;   Patient Active Problem List   Diagnosis Date Noted   Status post total right knee replacement 07/14/2022   Unilateral primary osteoarthritis, right knee 07/13/2022    PCP: Dr Jarrett Soho   REFERRING PROVIDER: Dr Doneen Poisson  REFERRING DIAG: R TKA   THERAPY DIAG:  Acute pain of right knee  Other abnormalities of gait and mobility  Stiffness of right knee, not elsewhere classified  Muscle weakness (generalized)  Localized edema  Rationale for Evaluation and Treatment Rehabilitation  ONSET DATE: 7/28  SUBJECTIVE:   SUBJECTIVE STATEMENT: Pt states that she has had no change in her foot drop. The knee is doing well but she does notice that she has less numbness down the front of the leg.   PERTINENT HISTORY: Right Knee OA  PAIN:  Are you having pain? Yes: NPRS scale: 4/10 Pain location: lateral knee now  Pain description: aching  Aggravating factors: standing, walking, morning, Relieving factors: gabapentin, Hydrocodone every 6 hours   PRECAUTIONS: None  WEIGHT BEARING RESTRICTIONS No  FALLS:  Has patient fallen in last 6 months?  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   EDEMA:  Circumferential: 56.7 46.3   POSTURE: No Significant postural limitations  PALPATION: No unexpected tenderness to palpation   LOWER EXTREMITY ROM:  Active ROM Right eval Left eval  Hip flexion    Hip extension    Hip abduction    Hip adduction    Hip internal rotation    Hip external rotation    Knee flexion 103   Knee extension -6   Ankle dorsiflexion    Ankle plantarflexion    Ankle inversion    Ankle eversion     (Blank rows = not tested)  Passive ROM Right eval Left eval  Hip flexion    Hip extension    Hip abduction    Hip adduction    Hip internal rotation    Hip external rotation    Knee flexion 110   Knee extension -5   Ankle dorsiflexion    Ankle plantarflexion    Ankle inversion    Ankle eversion     (  Blank rows = not tested)  LOWER EXTREMITY MMT:  MMT Right eval Left eval  Hip flexion 4+ 5  Hip extension    Hip abduction 4 5  Hip adduction    Hip internal rotation    Hip external rotation    Knee flexion    Knee extension 3+ 5  Ankle dorsiflexion 1 5  Ankle plantarflexion    Ankle inversion    Ankle eversion     (Blank rows = not tested)  FUNCTIONAL TESTS:  Mild use of hands for sit to stand   GAIT: Significant foot drop with ambulation    TODAY'S TREATMENT: 8/24 Russian E-stim supine ankle DF, started in standing and transitioned to seated due to fatigue 30 mA, 10/20 cycle, 80 bps freq, 5s ramp, pad along length of Tib ant and fibularis group Trace contraction noted, 8 min   Quad set 2x10  SAQ  3x10  SLR 2x10   Standing weight shift fwd and back x20  Standing march 2x10   Manual: PROM into knee flexion and extension  Nu-step 5 min L3 to tolerance   8/22  [place] E-stim seated ankle DF, started in standing and transitioned to seated due to fatigue 62 mA, 4/12 cycle, 80 bps freq, 5s ramp, pad along length of Tib ant and fibularis group Trace contraction noted, 10 mins  Standing HR 2x10 with UE support Seated LAQ 2x10 Standing NBOS on Airex for ankle stability 30s 4x 2 Standing TKE 10x     8/18 Russian L27 with contraction. Could feel the stim despite decreased sensation 10/10 with cuing to fire while it was ramped up  Noticeable DF activation   Manual PROM: into flexion and extension   Quad set 2x10  SAQ 3x10  SLR 2x10   Standing weight shift fwd and back  Standing march 2x5 with cuing to keep contralateral hip stable.   Reviewed HEP that she is doing already     PATIENT EDUCATION:  Education details: HEP, symptom management; progression of activity  Person educated: Patient and Spouse Education method: Explanation, Demonstration, Tactile cues, Verbal cues, and Handouts Education comprehension: verbalized understanding, returned demonstration, verbal cues required, tactile cues required, and needs further education   HOME EXERCISE PROGRAM: Reviewed current HEP. Patient educated on exercises to emphasize including AP's quad sets; lr and ex/flex stretching   ASSESSMENT:  CLINICAL IMPRESSION: Patient did better in a supine position as far as her DF goes. She was compensating a bit bvut she was getting some contraction. Her Knee ROM is progressing very well. We added the nu-step for endurance and strengthening. Therapy will continue to progress as tolerated.   OBJECTIVE IMPAIRMENTS Abnormal gait, decreased activity tolerance, decreased endurance, decreased mobility, difficulty walking, decreased ROM, decreased strength, and pain.   ACTIVITY LIMITATIONS  bending, standing, squatting, sleeping, stairs, transfers, bed mobility, dressing, and locomotion level  PARTICIPATION LIMITATIONS: meal prep, cleaning, laundry, driving, occupation, yard work, and church  PERSONAL FACTORS Time since onset of injury/illness/exacerbation are also affecting patient's functional outcome.   REHAB POTENTIAL: Excellent  CLINICAL DECISION MAKING: Evolving/moderate complexity moderate complexity 2nd to foot drop   EVALUATION COMPLEXITY: Low   GOALS: Goals reviewed with patient? Yes  SHORT TERM GOALS: Target date: 08/23/2022  Patient will increase right knee flexion to 120 degrees  Baseline: Goal status: INITIAL  2.  Patient will demonstrate 3/5 right ankle DF strength  Baseline:  Goal status: INITIAL  3.  Patient will demonstrate full knee extension  Baseline:  Goal status:  INITIAL   LONG TERM GOALS: Target date: 09/13/2022   Patient will walk community distances without pain in order to return to her job  Baseline:  Goal status: INITIAL  2.  Patient will go up/down 6 steps without pain  Baseline:  Goal status: INITIAL  3.  Patient will be independent with complete HEP  Baseline:    PLAN: PT FREQUENCY: 2x/week  PT DURATION: 8 weeks  PLANNED INTERVENTIONS: Therapeutic exercises, Therapeutic activity, Neuromuscular re-education, Balance training, Gait training, Patient/Family education, Self Care, Joint mobilization, Stair training, DME instructions, Aquatic Therapy, Dry Needling, Electrical stimulation, Cryotherapy, Moist heat, Traction, Ultrasound, and Manual therapy  PLAN FOR NEXT SESSION: Russian to anterior tib; knee ROM; SLR; TKE; heel raise; standing march; begin stair training when able.    Dessie Coma, PT 08/10/2022, 12:49 PM

## 2022-08-15 ENCOUNTER — Ambulatory Visit (HOSPITAL_BASED_OUTPATIENT_CLINIC_OR_DEPARTMENT_OTHER): Payer: BC Managed Care – PPO | Admitting: Physical Therapy

## 2022-08-15 ENCOUNTER — Encounter (HOSPITAL_BASED_OUTPATIENT_CLINIC_OR_DEPARTMENT_OTHER): Payer: Self-pay | Admitting: Physical Therapy

## 2022-08-15 DIAGNOSIS — R600 Localized edema: Secondary | ICD-10-CM

## 2022-08-15 DIAGNOSIS — M25561 Pain in right knee: Secondary | ICD-10-CM

## 2022-08-15 DIAGNOSIS — M6281 Muscle weakness (generalized): Secondary | ICD-10-CM

## 2022-08-15 DIAGNOSIS — M25661 Stiffness of right knee, not elsewhere classified: Secondary | ICD-10-CM

## 2022-08-15 DIAGNOSIS — R2689 Other abnormalities of gait and mobility: Secondary | ICD-10-CM

## 2022-08-15 NOTE — Therapy (Signed)
OUTPATIENT PHYSICAL THERAPY LOWER EXTREMITY TREATMENT   Patient Name: Virginia Fischer MRN: [id number] DOB:07/05/1956, 66 y.o., female Today's Date: 08/15/2022   PT End of Session - 08/15/22 0936     Visit Number 5    Number of Visits 16    Date for PT Re-Evaluation 09/27/22    PT Start Time 0930    PT Stop Time 1011    PT Time Calculation (min) 41 min    Activity Tolerance Patient tolerated treatment well    Behavior During Therapy [facility] for tasks assessed/performed              Past Medical History:  Diagnosis Date   Arthritis    Past Surgical History:  Procedure Laterality Date   CESAREAN SECTION  1995   TOTAL KNEE ARTHROPLASTY Right 07/14/2022   Procedure: RIGHT TOTAL KNEE ARTHROPLASTY;  Surgeon: Kathryne Hitch, MD;  Location: [facility];  Service: Orthopedics;  Laterality: Right;   Patient Active Problem List   Diagnosis Date Noted   Status post total right knee replacement 07/14/2022   Unilateral primary osteoarthritis, right knee 07/13/2022    PCP: Dr Jarrett Soho   REFERRING PROVIDER: Dr Doneen Poisson  REFERRING DIAG: R TKA   THERAPY DIAG:  Acute pain of right knee  Other abnormalities of gait and mobility  Stiffness of right knee, not elsewhere classified  Muscle weakness (generalized)  Localized edema  Rationale for Evaluation and Treatment Rehabilitation  ONSET DATE: 7/28  SUBJECTIVE:   SUBJECTIVE STATEMENT: The atient reports she may have a little activation of her anteriro tib but it is hard to tell. She rode a lot this weekend and tolerated well   PERTINENT HISTORY: Right Knee OA  PAIN:  Are you having pain? Yes: NPRS scale: 4/10 Pain location: lateral knee now  Pain description: aching  Aggravating factors: standing, walking, morning, Relieving factors: gabapentin, Hydrocodone every 6 hours   PRECAUTIONS: None  WEIGHT BEARING RESTRICTIONS No  FALLS:  Has patient fallen in last 6 months? 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   EDEMA:  Circumferential: 56.7 46.3   POSTURE: No Significant postural limitations  PALPATION: No unexpected tenderness to palpation   LOWER EXTREMITY ROM:  Active ROM Right eval Left eval  Hip flexion    Hip extension    Hip abduction    Hip adduction    Hip internal rotation    Hip external rotation    Knee flexion 103   Knee extension -6   Ankle dorsiflexion    Ankle plantarflexion    Ankle inversion    Ankle eversion     (Blank rows = not tested)  Passive ROM Right eval Left eval Right   Hip flexion     Hip extension     Hip abduction     Hip adduction     Hip internal rotation     Hip external rotation     Knee flexion 110  120  Knee extension -5  -3  Ankle dorsiflexion     Ankle plantarflexion  Ankle inversion     Ankle eversion      (Blank rows = not tested)  LOWER EXTREMITY MMT:  MMT Right eval Left eval  Hip flexion 4+ 5  Hip extension    Hip abduction 4 5  Hip adduction    Hip internal rotation    Hip external rotation    Knee flexion    Knee extension 3+ 5  Ankle dorsiflexion 1 5  Ankle plantarflexion    Ankle inversion    Ankle eversion     (Blank rows = not tested)  FUNCTIONAL TESTS:  Mild use of hands for sit to stand   GAIT: Significant foot drop with ambulation    TODAY'S TREATMENT: 8/29 Russian E-stim supine ankle DF, started in standing and transitioned to seated due to fatigue 30 mA, 10/20 cycle, 80 bps freq, 5s ramp, pad along length of Tib ant and fibularis group Trace contraction noted, 8 min   Quad set  2x10  SAQ 3x10  SLR 2x10   Step up 2x10 4 inch  Side step x10 4 inch   Manual: PROM into knee flexion and extension  Nu-step 5 min L3 to tolerance   8/24 [place] E-stim supine ankle DF, started in standing and transitioned to seated due to fatigue 30 mA, 10/20 cycle, 80 bps freq, 5s ramp, pad along length of Tib ant and fibularis group Trace contraction noted, 8 min   Quad set 2x10  SAQ 3x10  SLR 2x10   Standing weight shift fwd and back x20  Standing march 2x10   Manual: PROM into knee flexion and extension  Nu-step 5 min L3 to tolerance     PATIENT EDUCATION:  Education details: HEP, symptom management; progression of activity  Person educated: Patient and Spouse Education method: Explanation, Demonstration, Tactile cues, Verbal cues, and Handouts Education comprehension: verbalized understanding, returned demonstration, verbal cues required, tactile cues required, and needs further education   HOME EXERCISE PROGRAM: Reviewed current HEP. Patient educated on exercises to emphasize including AP's quad sets; lr and ex/flex stretching   ASSESSMENT:  CLINICAL IMPRESSION: The patient may be having a trace muscle contraction in her anterior tib. She is able to move her toe to neutral with compensatory muscles in supine. Her knee itself is doing very well. She already has full motion. We were able to initiate stair training. She did a 4 inch step without difficulty. We will continue to advance functional knee strengthening as well as work on ankle DF.    OBJECTIVE IMPAIRMENTS Abnormal gait, decreased activity tolerance, decreased endurance, decreased mobility, difficulty walking, decreased ROM, decreased strength, and pain.   ACTIVITY LIMITATIONS bending, standing, squatting, sleeping, stairs, transfers, bed mobility, dressing, and locomotion level  PARTICIPATION LIMITATIONS: meal prep, cleaning, laundry, driving, occupation, yard work, and church  PERSONAL FACTORS Time  since onset of injury/illness/exacerbation are also affecting patient's functional outcome.   REHAB POTENTIAL: Excellent  CLINICAL DECISION MAKING: Evolving/moderate complexity moderate complexity 2nd to foot drop   EVALUATION COMPLEXITY: Low   GOALS: Goals reviewed with patient? Yes  SHORT TERM GOALS: Target date: 08/23/2022  Patient will increase right knee flexion to 120 degrees  Baseline: Goal status: INITIAL  2.  Patient will demonstrate 3/5 right ankle DF strength  Baseline:  Goal status: INITIAL  3.  Patient will demonstrate full knee extension  Baseline:  Goal status: INITIAL   LONG TERM GOALS: Target date: 09/13/2022   Patient will walk community distances without pain in order to return to her  job  Baseline:  Goal status: INITIAL  2.  Patient will go up/down 6 steps without pain  Baseline:  Goal status: INITIAL  3.  Patient will be independent with complete HEP  Baseline:    PLAN: PT FREQUENCY: 2x/week  PT DURATION: 8 weeks  PLANNED INTERVENTIONS: Therapeutic exercises, Therapeutic activity, Neuromuscular re-education, Balance training, Gait training, Patient/Family education, Self Care, Joint mobilization, Stair training, DME instructions, Aquatic Therapy, Dry Needling, Electrical stimulation, Cryotherapy, Moist heat, Traction, Ultrasound, and Manual therapy  PLAN FOR NEXT SESSION: Russian to anterior tib; knee ROM; SLR; TKE; heel raise; standing march; begin stair training when able.    Dessie Coma, PT 08/15/2022, 9:37 AM

## 2022-08-15 NOTE — Telephone Encounter (Signed)
Please advise 

## 2022-08-15 NOTE — Telephone Encounter (Signed)
Ok to stop the compression socks too?

## 2022-08-16 ENCOUNTER — Encounter (HOSPITAL_BASED_OUTPATIENT_CLINIC_OR_DEPARTMENT_OTHER): Payer: Self-pay | Admitting: Physical Therapy

## 2022-08-17 ENCOUNTER — Encounter (HOSPITAL_BASED_OUTPATIENT_CLINIC_OR_DEPARTMENT_OTHER): Payer: Self-pay | Admitting: Physical Therapy

## 2022-08-17 ENCOUNTER — Ambulatory Visit (HOSPITAL_BASED_OUTPATIENT_CLINIC_OR_DEPARTMENT_OTHER): Payer: BC Managed Care – PPO | Admitting: Physical Therapy

## 2022-08-17 DIAGNOSIS — M6281 Muscle weakness (generalized): Secondary | ICD-10-CM

## 2022-08-17 DIAGNOSIS — R2689 Other abnormalities of gait and mobility: Secondary | ICD-10-CM

## 2022-08-17 DIAGNOSIS — R600 Localized edema: Secondary | ICD-10-CM

## 2022-08-17 DIAGNOSIS — M25561 Pain in right knee: Secondary | ICD-10-CM | POA: Diagnosis not present

## 2022-08-17 DIAGNOSIS — M25661 Stiffness of right knee, not elsewhere classified: Secondary | ICD-10-CM

## 2022-08-17 NOTE — Therapy (Signed)
OUTPATIENT PHYSICAL THERAPY LOWER EXTREMITY TREATMENT   Patient Name: Virginia Fischer MRN: [id number] DOB:[birth year], 66 y.o., female Today's Date: 08/17/2022   PT End of Session - 08/17/22 0806     Visit Number 6    Number of Visits 16    Date for PT Re-Evaluation 09/27/22    PT Start Time 0803    PT Stop Time 0845    PT Time Calculation (min) 42 min    Activity Tolerance Patient tolerated treatment well    Behavior During Therapy [facility] for tasks assessed/performed              Past Medical History:  Diagnosis Date   Arthritis    Past Surgical History:  Procedure Laterality Date   CESAREAN SECTION  1995   TOTAL KNEE ARTHROPLASTY Right 07/14/2022   Procedure: RIGHT TOTAL KNEE ARTHROPLASTY;  Surgeon: Mcarthur Rossetti, MD;  Location: [facility];  Service: Orthopedics;  Laterality: Right;   Patient Active Problem List   Diagnosis Date Noted   Status post total right knee replacement 07/14/2022   Unilateral primary osteoarthritis, right knee 07/13/2022    PCP: Dr Marda Stalker   REFERRING PROVIDER: Dr Jean Rosenthal  REFERRING DIAG: R TKA   THERAPY DIAG:  Acute pain of right knee  Other abnormalities of gait and mobility  Stiffness of right knee, not elsewhere classified  Muscle weakness (generalized)  Localized edema  Rationale for Evaluation and Treatment Rehabilitation  ONSET DATE: 7/28  SUBJECTIVE:   SUBJECTIVE STATEMENT: The patien treports that yesterday during the eving she began having contralateral low back pain. It continues to hurt this morning   PERTINENT HISTORY: Right Knee OA  PAIN:  Are you having pain? Yes: NPRS scale: 5/10  Pain location: Low back  Pain description: aching  Aggravating factors: standing, walking, morning, Relieving factors: tylenol   PRECAUTIONS: None  WEIGHT BEARING RESTRICTIONS No  FALLS:  Has patient fallen in last 6 months? 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   EDEMA:  Circumferential: 56.7 46.3   POSTURE: No Significant postural limitations  PALPATION: No unexpected tenderness to palpation   LOWER EXTREMITY ROM:  Active ROM Right eval Left eval  Hip flexion    Hip extension    Hip abduction    Hip adduction    Hip internal rotation    Hip external rotation    Knee flexion 103   Knee extension -6   Ankle dorsiflexion    Ankle plantarflexion    Ankle inversion    Ankle eversion     (Blank rows = not tested)  Passive ROM Right eval Left eval Right   Hip flexion     Hip extension     Hip abduction     Hip adduction     Hip internal rotation     Hip external rotation     Knee flexion 110  120  Knee extension -5  -3  Ankle dorsiflexion     Ankle plantarflexion     Ankle inversion     Ankle eversion      (  Blank rows = not tested)  LOWER EXTREMITY MMT:  MMT Right eval Left eval  Hip flexion 4+ 5  Hip extension    Hip abduction 4 5  Hip adduction    Hip internal rotation    Hip external rotation    Knee flexion    Knee extension 3+ 5  Ankle dorsiflexion 1 5  Ankle plantarflexion    Ankle inversion    Ankle eversion     (Blank rows = not tested)  FUNCTIONAL TESTS:  Mild use of hands for sit to stand   GAIT: Significant foot drop with ambulation    TODAY'S TREATMENT: 8/31 [place] E-stim supine ankle DF, started in standing and transitioned to seated due to fatigue 30 mA, 10/20 cycle, 80 bps freq, 5s ramp, pad along length of Tib ant and fibularis group Trace contraction noted, 8 min   Quad set 2x10  SAQ 3x10  SLR 2x10   Step up 2x10 4 inch  Side  step x10 4 inch   Manual: PROM into knee flexion and extension  Nu-step 5 min L3 to tolerance   Tennis ball trigger point release  Gluteal stretch on the left 2x30 sec hold supine but reviewed how to do in sitting    8/29 [place] E-stim supine ankle DF, started in standing and transitioned to seated due to fatigue 30 mA, 10/20 cycle, 80 bps freq, 5s ramp, pad along length of Tib ant and fibularis group Trace contraction noted, 8 min   Quad set 2x10  SAQ 3x10  SLR 2x10   Step up 2x10 4 inch  Side step x10 4 inch   Manual: PROM into knee flexion and extension  Nu-step 5 min L3 to tolerance   8/24 [place] E-stim supine ankle DF, started in standing and transitioned to seated due to fatigue 30 mA, 10/20 cycle, 80 bps freq, 5s ramp, pad along length of Tib ant and fibularis group Trace contraction noted, 8 min   Quad set 2x10  SAQ 3x10  SLR 2x10   Standing weight shift fwd and back x20  Standing march 2x10   Manual: PROM into knee flexion and extension  Nu-step 5 min L3 to tolerance     PATIENT EDUCATION:  Education details: HEP, symptom management; progression of activity  Person educated: Patient and Spouse Education method: Explanation, Demonstration, Tactile cues, Verbal cues, and Handouts Education comprehension: verbalized understanding, returned demonstration, verbal cues required, tactile cues required, and needs further education   HOME EXERCISE PROGRAM: Reviewed current HEP. Patient educated on exercises to emphasize including AP's quad sets; lr and ex/flex stretching   ASSESSMENT:  CLINICAL IMPRESSION: The patients knee continues to progress well. She continues to get only a trace muscle firing of the anterior tib and no real firing when walking. This is likely contributing to her lower back pain. She reported mild relief with stretches. We will continue to monitor her lower back   OBJECTIVE IMPAIRMENTS Abnormal gait, decreased activity tolerance,  decreased endurance, decreased mobility, difficulty walking, decreased ROM, decreased strength, and pain.   ACTIVITY LIMITATIONS bending, standing, squatting, sleeping, stairs, transfers, bed mobility, dressing, and locomotion level  PARTICIPATION LIMITATIONS: meal prep, cleaning, laundry, driving, occupation, yard work, and church  PERSONAL FACTORS Time since onset of injury/illness/exacerbation are also affecting patient's functional outcome.   REHAB POTENTIAL: Excellent  CLINICAL DECISION MAKING: Evolving/moderate complexity moderate complexity 2nd to foot drop   EVALUATION COMPLEXITY: Low   GOALS: Goals reviewed with patient? Yes  SHORT TERM GOALS: Target  date: 08/23/2022 8/31 Patient will increase right knee flexion to 120 degrees  Baseline: Goal status: progressing   2.  Patient will demonstrate 3/5 right ankle DF strength  Baseline:  Goal status: still only trace firing   3.  Patient will demonstrate full knee extension  Baseline:  Goal status: Improving    LONG TERM GOALS: Target date: 09/13/2022   Patient will walk community distances without pain in order to return to her job  Baseline:  Goal status: INITIAL  2.  Patient will go up/down 6 steps without pain  Baseline:  Goal status: INITIAL  3.  Patient will be independent with complete HEP  Baseline:    PLAN: PT FREQUENCY: 2x/week  PT DURATION: 8 weeks  PLANNED INTERVENTIONS: Therapeutic exercises, Therapeutic activity, Neuromuscular re-education, Balance training, Gait training, Patient/Family education, Self Care, Joint mobilization, Stair training, DME instructions, Aquatic Therapy, Dry Needling, Electrical stimulation, Cryotherapy, Moist heat, Traction, Ultrasound, and Manual therapy  PLAN FOR NEXT SESSION: Russian to anterior tib; knee ROM; SLR; TKE; heel raise; standing march; begin stair training when able.    Dessie Coma, PT 08/17/2022, 8:07 AM

## 2022-08-22 MED ORDER — RIVAROXABAN 20 MG PO TABS: 20.0000 mg | ORAL_TABLET | Freq: Every day | ORAL | 0 refills | Status: DC

## 2022-08-23 ENCOUNTER — Ambulatory Visit (HOSPITAL_BASED_OUTPATIENT_CLINIC_OR_DEPARTMENT_OTHER): Payer: BC Managed Care – PPO | Attending: Physician Assistant | Admitting: Physical Therapy

## 2022-08-23 ENCOUNTER — Encounter (HOSPITAL_BASED_OUTPATIENT_CLINIC_OR_DEPARTMENT_OTHER): Payer: Self-pay | Admitting: Physical Therapy

## 2022-08-23 DIAGNOSIS — M25661 Stiffness of right knee, not elsewhere classified: Secondary | ICD-10-CM | POA: Diagnosis present

## 2022-08-23 DIAGNOSIS — M25561 Pain in right knee: Secondary | ICD-10-CM | POA: Diagnosis present

## 2022-08-23 DIAGNOSIS — M6281 Muscle weakness (generalized): Secondary | ICD-10-CM | POA: Diagnosis present

## 2022-08-23 DIAGNOSIS — R600 Localized edema: Secondary | ICD-10-CM | POA: Diagnosis present

## 2022-08-23 DIAGNOSIS — R2689 Other abnormalities of gait and mobility: Secondary | ICD-10-CM | POA: Insufficient documentation

## 2022-08-23 NOTE — Therapy (Signed)
OUTPATIENT PHYSICAL THERAPY LOWER EXTREMITY TREATMENT   Patient Name: Virginia Fischer MRN: [id number] DOB:[birth year], 66 y.o., female Today's Date: 08/23/2022   PT End of Session - 08/23/22 1013     Visit Number 7    Number of Visits 16    Date for PT Re-Evaluation 09/27/22    PT Start Time 0933    PT Stop Time 1012    PT Time Calculation (min) 39 min    Activity Tolerance Patient tolerated treatment well    Behavior During Therapy [facility] for tasks assessed/performed               Past Medical History:  Diagnosis Date   Arthritis    Past Surgical History:  Procedure Laterality Date   CESAREAN SECTION  1995   TOTAL KNEE ARTHROPLASTY Right 07/14/2022   Procedure: RIGHT TOTAL KNEE ARTHROPLASTY;  Surgeon: Kathryne Hitch, MD;  Location: [facility];  Service: Orthopedics;  Laterality: Right;   Patient Active Problem List   Diagnosis Date Noted   Status post total right knee replacement 07/14/2022   Unilateral primary osteoarthritis, right knee 07/13/2022    PCP: Dr Jarrett Soho   REFERRING PROVIDER: Dr Doneen Poisson  REFERRING DIAG: R TKA   THERAPY DIAG:  Acute pain of right knee  Other abnormalities of gait and mobility  Stiffness of right knee, not elsewhere classified  Muscle weakness (generalized)  Localized edema  Rationale for Evaluation and Treatment Rehabilitation  ONSET DATE: 7/28  SUBJECTIVE:   SUBJECTIVE STATEMENT:  I'm still having trouble with my foot, knee feels OK but my foot and ankle are still a problem. Its hard for me to stand up for a very long time at all, some of it is my leg and knee some of it is anxiety. I'm done after about 15-20 minutes.   PERTINENT HISTORY: Right Knee OA  PAIN:  Are you having pain? Yes: NPRS scale: /10  Pain location: Low back  Pain description: aching  Aggravating factors: standing, walking, morning, Relieving factors: tylenol   PRECAUTIONS: None  WEIGHT BEARING RESTRICTIONS  No  FALLS:  Has patient fallen in last 6 months? 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   EDEMA:  Circumferential: 56.7 46.3   POSTURE: No Significant postural limitations  PALPATION: No unexpected tenderness to palpation   LOWER EXTREMITY ROM:  Active ROM Right eval Left eval  Hip flexion    Hip extension    Hip abduction    Hip adduction    Hip internal rotation    Hip external rotation    Knee flexion 103   Knee extension -6   Ankle dorsiflexion    Ankle plantarflexion    Ankle inversion    Ankle eversion     (Blank rows = not tested)  Passive ROM Right eval Left eval Right   Hip flexion     Hip extension     Hip abduction     Hip adduction     Hip internal rotation     Hip external rotation     Knee  flexion 110  120  Knee extension -5  -3  Ankle dorsiflexion     Ankle plantarflexion     Ankle inversion     Ankle eversion      (Blank rows = not tested)  LOWER EXTREMITY MMT:  MMT Right eval Left eval  Hip flexion 4+ 5  Hip extension    Hip abduction 4 5  Hip adduction    Hip internal rotation    Hip external rotation    Knee flexion    Knee extension 3+ 5  Ankle dorsiflexion 1 5  Ankle plantarflexion    Ankle inversion    Ankle eversion     (Blank rows = not tested)  FUNCTIONAL TESTS:  Mild use of hands for sit to stand   GAIT: Significant foot drop with ambulation    TODAY'S TREATMENT:  9/6  Nustep x6 minutes L4 BLEs only  Education on possible use of brace/AFO for foot drop down the line- would avoid pressure on the bruises on the back of her calf  until they heal, also discussed foot up brace which would avoid this issue entirely and allow her to establish more normal gait pattern, education on tennis ball massage to proximal quad and hip flexor but stop if pain increases   SAQs 5# 2x10 3 second holds LAQs 5# 2x10  1 second hold STS weight midline with eccentric lower x15  Forward and lateral step ups 6 inch box x10 each R LE Forward step downs 4 inch box 2x10     8/31 [place] E-stim supine ankle DF, started in standing and transitioned to seated due to fatigue 30 mA, 10/20 cycle, 80 bps freq, 5s ramp, pad along length of Tib ant and fibularis group Trace contraction noted, 8 min   Quad set 2x10  SAQ 3x10  SLR 2x10   Step up 2x10 4 inch  Side step x10 4 inch   Manual: PROM into knee flexion and extension  Nu-step 5 min L3 to tolerance   Tennis ball trigger point release  Gluteal stretch on the left 2x30 sec hold supine but reviewed how to do in sitting    8/29 [place] E-stim supine ankle DF, started in standing and transitioned to seated due to fatigue 30 mA, 10/20 cycle, 80 bps freq, 5s ramp, pad along length of Tib ant and fibularis group Trace contraction noted, 8 min   Quad set 2x10  SAQ 3x10  SLR 2x10   Step up 2x10 4 inch  Side step x10 4 inch   Manual: PROM into knee flexion and extension  Nu-step 5 min L3 to tolerance   8/24 [place] E-stim supine ankle DF, started in standing and transitioned to seated due to fatigue 30 mA, 10/20 cycle, 80 bps freq, 5s ramp, pad along length of Tib ant and fibularis group Trace contraction noted, 8 min   Quad set 2x10  SAQ 3x10  SLR 2x10   Standing weight shift fwd and back x20  Standing march 2x10   Manual: PROM into knee flexion and extension  Nu-step 5 min L3 to tolerance     PATIENT EDUCATION:  Education details: HEP, symptom management; progression of activity  Person educated: Patient and Spouse Education method: Explanation, Demonstration,  Tactile cues, Verbal cues, and Handouts Education comprehension: verbalized understanding, returned demonstration, verbal cues required, tactile cues required, and needs further education   HOME EXERCISE PROGRAM: Reviewed current HEP. Patient educated on exercises to emphasize including AP's quad sets; lr and ex/flex  stretching   ASSESSMENT:  CLINICAL IMPRESSION:  Taj arrives today doing OK, most concerned about her foot drop; we deferred estim today and increased focus on general strengthening. Definitely has some quad weakness- per past measures in supine able to get to 120-124 degrees extension but had almost a 20 degree lag with LAQ against gravity this morning. Worked in some progressions of quad strengthening as well today.  Will continue to progress as able.   OBJECTIVE IMPAIRMENTS Abnormal gait, decreased activity tolerance, decreased endurance, decreased mobility, difficulty walking, decreased ROM, decreased strength, and pain.   ACTIVITY LIMITATIONS bending, standing, squatting, sleeping, stairs, transfers, bed mobility, dressing, and locomotion level  PARTICIPATION LIMITATIONS: meal prep, cleaning, laundry, driving, occupation, yard work, and church  PERSONAL FACTORS Time since onset of injury/illness/exacerbation are also affecting patient's functional outcome.   REHAB POTENTIAL: Excellent  CLINICAL DECISION MAKING: Evolving/moderate complexity moderate complexity 2nd to foot drop   EVALUATION COMPLEXITY: Low   GOALS: Goals reviewed with patient? Yes  SHORT TERM GOALS: Target date: 08/23/2022 8/31 Patient will increase right knee flexion to 120 degrees  Baseline: Goal status: progressing   2.  Patient will demonstrate 3/5 right ankle DF strength  Baseline:  Goal status: still only trace firing   3.  Patient will demonstrate full knee extension  Baseline:  Goal status: Improving    LONG TERM GOALS: Target date: 09/13/2022   Patient will walk community  distances without pain in order to return to her job  Baseline:  Goal status: INITIAL  2.  Patient will go up/down 6 steps without pain  Baseline:  Goal status: INITIAL  3.  Patient will be independent with complete HEP  Baseline:    PLAN: PT FREQUENCY: 2x/week  PT DURATION: 8 weeks  PLANNED INTERVENTIONS: Therapeutic exercises, Therapeutic activity, Neuromuscular re-education, Balance training, Gait training, Patient/Family education, Self Care, Joint mobilization, Stair training, DME instructions, Aquatic Therapy, Dry Needling, Electrical stimulation, Cryotherapy, Moist heat, Traction, Ultrasound, and Manual therapy  PLAN FOR NEXT SESSION: Russian to anterior tib; knee ROM; SLR; TKE; heel raise; standing march; begin stair training when able.    Hale Chalfin U PT DPT PN2  08/23/2022, 10:14 AM

## 2022-08-25 ENCOUNTER — Ambulatory Visit (HOSPITAL_BASED_OUTPATIENT_CLINIC_OR_DEPARTMENT_OTHER): Payer: BC Managed Care – PPO | Admitting: Physical Therapy

## 2022-08-25 ENCOUNTER — Encounter (HOSPITAL_BASED_OUTPATIENT_CLINIC_OR_DEPARTMENT_OTHER): Payer: Self-pay | Admitting: Physical Therapy

## 2022-08-25 DIAGNOSIS — R600 Localized edema: Secondary | ICD-10-CM

## 2022-08-25 DIAGNOSIS — R2689 Other abnormalities of gait and mobility: Secondary | ICD-10-CM

## 2022-08-25 DIAGNOSIS — M25561 Pain in right knee: Secondary | ICD-10-CM

## 2022-08-25 DIAGNOSIS — M6281 Muscle weakness (generalized): Secondary | ICD-10-CM

## 2022-08-25 DIAGNOSIS — M25661 Stiffness of right knee, not elsewhere classified: Secondary | ICD-10-CM

## 2022-08-25 NOTE — Therapy (Signed)
OUTPATIENT PHYSICAL THERAPY LOWER EXTREMITY TREATMENT   Patient Name: Virginia Fischer MRN: [id number] DOB:[birth year], 66 y.o., female Today's Date: 08/25/2022   PT End of Session - 08/25/22 1340     Visit Number 8    Number of Visits 16    Date for PT Re-Evaluation 09/27/22    PT Start Time 1302    PT Stop Time 1341    PT Time Calculation (min) 39 min    Activity Tolerance Patient tolerated treatment well    Behavior During Therapy [facility] for tasks assessed/performed                Past Medical History:  Diagnosis Date   Arthritis    Past Surgical History:  Procedure Laterality Date   CESAREAN SECTION  1995   TOTAL KNEE ARTHROPLASTY Right 07/14/2022   Procedure: RIGHT TOTAL KNEE ARTHROPLASTY;  Surgeon: Kathryne Hitch, MD;  Location: [facility];  Service: Orthopedics;  Laterality: Right;   Patient Active Problem List   Diagnosis Date Noted   Status post total right knee replacement 07/14/2022   Unilateral primary osteoarthritis, right knee 07/13/2022    PCP: Dr Jarrett Soho   REFERRING PROVIDER: Dr Doneen Poisson  REFERRING DIAG: R TKA   THERAPY DIAG:  Acute pain of right knee  Other abnormalities of gait and mobility  Stiffness of right knee, not elsewhere classified  Muscle weakness (generalized)  Localized edema  Rationale for Evaluation and Treatment Rehabilitation  ONSET DATE: 7/28  SUBJECTIVE:   SUBJECTIVE STATEMENT:  Doing OK, feeling good; feeling pretty warmed up as I was really busy around the house this morning.   PERTINENT HISTORY: Right Knee OA  PAIN:  Are you having pain? Yes: NPRS scale:4-5 /10  Pain location: back is better, most pain is in the leg  Pain description: aching, feels tight  Aggravating factors: standing, walking Relieving factors: tylenol   PRECAUTIONS: None  WEIGHT BEARING RESTRICTIONS No  FALLS:  Has patient fallen in last 6 months? 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   EDEMA:  Circumferential: 56.7 46.3   POSTURE: No Significant postural limitations  PALPATION: No unexpected tenderness to palpation   LOWER EXTREMITY ROM:  Active ROM Right eval Left eval  Hip flexion    Hip extension    Hip abduction    Hip adduction    Hip internal rotation    Hip external rotation    Knee flexion 103   Knee extension -6   Ankle dorsiflexion    Ankle plantarflexion    Ankle inversion    Ankle eversion     (Blank rows = not tested)  Passive ROM Right eval Left eval Right   Hip flexion     Hip extension     Hip abduction     Hip adduction     Hip internal rotation     Hip external rotation     Knee flexion 110  120  Knee extension -5  -3  Ankle dorsiflexion     Ankle plantarflexion     Ankle inversion  Ankle eversion      (Blank rows = not tested)  LOWER EXTREMITY MMT:  MMT Right eval Left eval  Hip flexion 4+ 5  Hip extension    Hip abduction 4 5  Hip adduction    Hip internal rotation    Hip external rotation    Knee flexion    Knee extension 3+ 5  Ankle dorsiflexion 1 5  Ankle plantarflexion    Ankle inversion    Ankle eversion     (Blank rows = not tested)  FUNCTIONAL TESTS:  Mild use of hands for sit to stand   GAIT: Significant foot drop with ambulation    TODAY'S TREATMENT:  08/25/22  Nustep L5x6 minutes BLEs only   LAQs 6# 2x10 2 second holds  SAQs 6# 2x10 2 seconds  Education on use of foot up brace to normalize gait mechanics and reduce circumduction/high stepping pattern and possible impacts on low back or hip pain; education on quad  mechanics and eccentric vs concentric mm action/impact functionally   Bridges 1x15 red TB  Sidelying clamshells red TB x10 B Forward and lateral step ups 6 inch box x15 R LE             Step training (reciprocal) with close S; poor eccentric quad control noted on 6 inch steps            Forward step downs with heel tap 2x10 R LE    9/6  Nustep x6 minutes L4 BLEs only  Education on possible use of brace/AFO for foot drop down the line- would avoid pressure on the bruises on the back of her calf until they heal, also discussed foot up brace which would avoid this issue entirely and allow her to establish more normal gait pattern, education on tennis ball massage to proximal quad and hip flexor but stop if pain increases   SAQs 5# 2x10 3 second holds LAQs 5# 2x10  1 second hold STS weight midline with eccentric lower x15  Forward and lateral step ups 6 inch box x10 each R LE Forward step downs 4 inch box 2x10     8/31 [place] E-stim supine ankle DF, started in standing and transitioned to seated due to fatigue 30 mA, 10/20 cycle, 80 bps freq, 5s ramp, pad along length of Tib ant and fibularis group Trace contraction noted, 8 min   Quad set 2x10  SAQ 3x10  SLR 2x10   Step up 2x10 4 inch  Side step x10 4 inch   Manual: PROM into knee flexion and extension  Nu-step 5 min L3 to tolerance   Tennis ball trigger point release  Gluteal stretch on the left 2x30 sec hold supine but reviewed how to do in sitting    8/29 [place] E-stim supine ankle DF, started in standing and transitioned to seated due to fatigue 30 mA, 10/20 cycle, 80 bps freq, 5s ramp, pad along length of Tib ant and fibularis group Trace contraction noted, 8 min   Quad set 2x10  SAQ 3x10  SLR 2x10   Step up 2x10 4 inch  Side step x10 4 inch   Manual: PROM into knee flexion and extension  Nu-step 5 min L3 to tolerance   8/24 [place] E-stim supine ankle DF, started in standing and transitioned to seated  due to fatigue 30 mA, 10/20 cycle, 80 bps freq, 5s ramp, pad along length of Tib ant and fibularis group Trace contraction noted, 8 min   Quad set 2x10  SAQ 3x10  SLR 2x10   Standing weight shift fwd and back x20  Standing march 2x10   Manual: PROM into knee flexion and extension  Nu-step 5 min L3 to tolerance     PATIENT EDUCATION:  Education details: HEP, symptom management; progression of activity  Person educated: Patient and Spouse Education method: Explanation, Demonstration, Tactile cues, Verbal cues, and Handouts Education comprehension: verbalized understanding, returned demonstration, verbal cues required, tactile cues required, and needs further education   HOME EXERCISE PROGRAM:  [id number] + initial HEP given at eval (code not in chart)  ASSESSMENT:  CLINICAL IMPRESSION:  Malika arrives doing OK today, still frustrated about foot drop after surgery. Continued working on quad strength as well as closed chain activities today, updated HEP as well. Continued standing activities and stair training today too . Did have a bit of a stumble walking in gym due to foot drop, continue to recommend foot up brace to help normalize mechanics and reduce fall risk. Will continue to progress as able and tolerated- fortunately ROM is looking great thus far!    OBJECTIVE IMPAIRMENTS Abnormal gait, decreased activity tolerance, decreased endurance, decreased mobility, difficulty walking, decreased ROM, decreased strength, and pain.   ACTIVITY LIMITATIONS bending, standing, squatting, sleeping, stairs, transfers, bed mobility, dressing, and locomotion level  PARTICIPATION LIMITATIONS: meal prep, cleaning, laundry, driving, occupation, yard work, and church  PERSONAL FACTORS Time since onset of injury/illness/exacerbation are also affecting patient's functional outcome.   REHAB POTENTIAL: Excellent  CLINICAL DECISION MAKING: Evolving/moderate complexity moderate complexity 2nd to  foot drop   EVALUATION COMPLEXITY: Low   GOALS: Goals reviewed with patient? Yes  SHORT TERM GOALS: Target date: 08/23/2022 8/31 Patient will increase right knee flexion to 120 degrees  Baseline: Goal status: progressing   2.  Patient will demonstrate 3/5 right ankle DF strength  Baseline:  Goal status: still only trace firing   3.  Patient will demonstrate full knee extension  Baseline:  Goal status: Improving    LONG TERM GOALS: Target date: 09/13/2022   Patient will walk community distances without pain in order to return to her job  Baseline:  Goal status: INITIAL  2.  Patient will go up/down 6 steps without pain  Baseline:  Goal status: INITIAL  3.  Patient will be independent with complete HEP  Baseline:    PLAN: PT FREQUENCY: 2x/week  PT DURATION: 8 weeks  PLANNED INTERVENTIONS: Therapeutic exercises, Therapeutic activity, Neuromuscular re-education, Balance training, Gait training, Patient/Family education, Self Care, Joint mobilization, Stair training, DME instructions, Aquatic Therapy, Dry Needling, Electrical stimulation, Cryotherapy, Moist heat, Traction, Ultrasound, and Manual therapy  PLAN FOR NEXT SESSION: Russian to anterior tib; knee ROM; SLR; TKE; heel raise; standing march; begin stair training when able.    Lerry Liner PT DPT PN2  08/25/2022, 1:41 PM

## 2022-08-29 ENCOUNTER — Ambulatory Visit (HOSPITAL_BASED_OUTPATIENT_CLINIC_OR_DEPARTMENT_OTHER): Payer: BC Managed Care – PPO | Admitting: Physical Therapy

## 2022-08-29 DIAGNOSIS — R600 Localized edema: Secondary | ICD-10-CM

## 2022-08-29 DIAGNOSIS — R2689 Other abnormalities of gait and mobility: Secondary | ICD-10-CM

## 2022-08-29 DIAGNOSIS — M25661 Stiffness of right knee, not elsewhere classified: Secondary | ICD-10-CM

## 2022-08-29 DIAGNOSIS — M25561 Pain in right knee: Secondary | ICD-10-CM | POA: Diagnosis not present

## 2022-08-29 DIAGNOSIS — M6281 Muscle weakness (generalized): Secondary | ICD-10-CM

## 2022-08-29 NOTE — Therapy (Signed)
OUTPATIENT PHYSICAL THERAPY LOWER EXTREMITY TREATMENT   Patient Name: Virginia Fischer MRN: [id number] DOB:1956/08/07, 66 y.o., female Today's Date: 08/29/2022        Past Medical History:  Diagnosis Date   Arthritis    Past Surgical History:  Procedure Laterality Date   CESAREAN SECTION  1995   TOTAL KNEE ARTHROPLASTY Right 07/14/2022   Procedure: RIGHT TOTAL KNEE ARTHROPLASTY;  Surgeon: Kathryne Hitch, MD;  Location: [facility];  Service: Orthopedics;  Laterality: Right;   Patient Active Problem List   Diagnosis Date Noted   Status post total right knee replacement 07/14/2022   Unilateral primary osteoarthritis, right knee 07/13/2022    PCP: Dr Jarrett Soho   REFERRING PROVIDER: Dr Doneen Poisson  REFERRING DIAG: R TKA   THERAPY DIAG:  No diagnosis found.  Rationale for Evaluation and Treatment Rehabilitation  ONSET DATE: 7/28  SUBJECTIVE:   SUBJECTIVE STATEMENT:  Doing OK, feeling good; feeling pretty warmed up as I was really busy around the house this morning.   PERTINENT HISTORY: Right Knee OA  PAIN:  Are you having pain? Yes: NPRS scale:4-5 /10  Pain location: back is better, most pain is in the leg  Pain description: aching, feels tight  Aggravating factors: standing, walking Relieving factors: tylenol   PRECAUTIONS: None  WEIGHT BEARING RESTRICTIONS No  FALLS:  Has patient fallen in last 6 months? 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   EDEMA:  Circumferential: 56.7 46.3   POSTURE: No Significant postural limitations  PALPATION: No  unexpected tenderness to palpation   LOWER EXTREMITY ROM:  Active ROM Right eval Left eval  Hip flexion    Hip extension    Hip abduction    Hip adduction    Hip internal rotation    Hip external rotation    Knee flexion 103   Knee extension -6   Ankle dorsiflexion    Ankle plantarflexion    Ankle inversion    Ankle eversion     (Blank rows = not tested)  Passive ROM Right eval Left eval Right   Hip flexion     Hip extension     Hip abduction     Hip adduction     Hip internal rotation     Hip external rotation     Knee flexion 110  120  Knee extension -5  -3  Ankle dorsiflexion     Ankle plantarflexion     Ankle inversion     Ankle eversion      (Blank rows = not tested)  LOWER EXTREMITY MMT:  MMT Right eval Left eval  Hip flexion 4+ 5  Hip extension    Hip abduction 4 5  Hip adduction    Hip internal rotation    Hip external rotation    Knee flexion    Knee extension 3+ 5  Ankle dorsiflexion 1 5  Ankle plantarflexion    Ankle inversion    Ankle eversion     (Blank rows = not tested)  FUNCTIONAL TESTS:  Mild use of hands for sit  to stand   GAIT: Significant foot drop with ambulation    TODAY'S TREATMENT: 9/12 Nustep L5x6 minutes BLEs only Forward and lateral step ups 6 inch box x15 R LE   SLR 2x10 1.5 lbs  Bridge 2x15 SAQ 2x15 5 lbs  LAQ 3x10 5 lbs   Standing heel raise to church pew x20    08/25/22  Nustep L5x6 minutes BLEs only   LAQs 6# 2x10 2 second holds  SAQs 6# 2x10 2 seconds  Education on use of foot up brace to normalize gait mechanics and reduce circumduction/high stepping pattern and possible impacts on low back or hip pain; education on quad mechanics and eccentric vs concentric mm action/impact functionally   Bridges 1x15 red TB  Sidelying clamshells red TB x10 B Forward and lateral step ups 6 inch box x15 R LE             Step training (reciprocal) with close S; poor eccentric quad control noted on 6 inch steps             Forward step downs with heel tap 2x10 R LE    9/6  Nustep x6 minutes L4 BLEs only  Education on possible use of brace/AFO for foot drop down the line- would avoid pressure on the bruises on the back of her calf until they heal, also discussed foot up brace which would avoid this issue entirely and allow her to establish more normal gait pattern, education on tennis ball massage to proximal quad and hip flexor but stop if pain increases   SAQs 5# 2x10 3 second holds LAQs 5# 2x10  1 second hold STS weight midline with eccentric lower x15  Forward and lateral step ups 6 inch box x10 each R LE Forward step downs 4 inch box 2x10     8/31 [place] E-stim supine ankle DF, started in standing and transitioned to seated due to fatigue 30 mA, 10/20 cycle, 80 bps freq, 5s ramp, pad along length of Tib ant and fibularis group Trace contraction noted, 8 min   Quad set 2x10  SAQ 3x10  SLR 2x10   Step up 2x10 4 inch  Side step x10 4 inch   Manual: PROM into knee flexion and extension  Nu-step 5 min L3 to tolerance   Tennis ball trigger point release  Gluteal stretch on the left 2x30 sec hold supine but reviewed how to do in sitting    8/29 [place] E-stim supine ankle DF, started in standing and transitioned to seated due to fatigue 30 mA, 10/20 cycle, 80 bps freq, 5s ramp, pad along length of Tib ant and fibularis group Trace contraction noted, 8 min   Quad set 2x10  SAQ 3x10  SLR 2x10   Step up 2x10 4 inch  Side step x10 4 inch   Manual: PROM into knee flexion and extension  Nu-step 5 min L3 to tolerance   8/24 [place] E-stim supine ankle DF, started in standing and transitioned to seated due to fatigue 30 mA, 10/20 cycle, 80 bps freq, 5s ramp, pad along length of Tib ant and fibularis group Trace contraction noted, 8 min   Quad set 2x10  SAQ 3x10  SLR 2x10   Standing weight shift fwd and back x20  Standing march 2x10   Manual: PROM into knee flexion and  extension  Nu-step 5 min L3 to tolerance     PATIENT EDUCATION:  Education details: HEP, symptom management; progression of activity  Person educated: Patient and  Spouse Education method: Explanation, Demonstration, Tactile cues, Verbal cues, and Handouts Education comprehension: verbalized understanding, returned demonstration, verbal cues required, tactile cues required, and needs further education   HOME EXERCISE PROGRAM:  [id number] + initial HEP given at eval (code not in chart)  ASSESSMENT:  CLINICAL IMPRESSION:  The patient is doing well with her nee. Her range was measured at 0-126. She is having very little pain in the knee. Her weights have progressed well with her exercises. Her single leg stance is improving. With her AFO, her gait looks good without the cane. She was advised to use the AFO if she is walking without the cane. We continue to advance her step height. We will perform a full progress note next visit   OBJECTIVE IMPAIRMENTS Abnormal gait, decreased activity tolerance, decreased endurance, decreased mobility, difficulty walking, decreased ROM, decreased strength, and pain.   ACTIVITY LIMITATIONS bending, standing, squatting, sleeping, stairs, transfers, bed mobility, dressing, and locomotion level  PARTICIPATION LIMITATIONS: meal prep, cleaning, laundry, driving, occupation, yard work, and church  PERSONAL FACTORS Time since onset of injury/illness/exacerbation are also affecting patient's functional outcome.   REHAB POTENTIAL: Excellent  CLINICAL DECISION MAKING: Evolving/moderate complexity moderate complexity 2nd to foot drop   EVALUATION COMPLEXITY: Low   GOALS: Goals reviewed with patient? Yes  SHORT TERM GOALS: Target date: 08/23/2022 8/31 Patient will increase right knee flexion to 120 degrees  Baseline: Goal status: progressing   2.  Patient will demonstrate 3/5 right ankle DF strength  Baseline:  Goal status: still only trace firing   3.   Patient will demonstrate full knee extension  Baseline:  Goal status: Improving    LONG TERM GOALS: Target date: 09/13/2022   Patient will walk community distances without pain in order to return to her job  Baseline:  Goal status: INITIAL  2.  Patient will go up/down 6 steps without pain  Baseline:  Goal status: INITIAL  3.  Patient will be independent with complete HEP  Baseline:    PLAN: PT FREQUENCY: 2x/week  PT DURATION: 8 weeks  PLANNED INTERVENTIONS: Therapeutic exercises, Therapeutic activity, Neuromuscular re-education, Balance training, Gait training, Patient/Family education, Self Care, Joint mobilization, Stair training, DME instructions, Aquatic Therapy, Dry Needling, Electrical stimulation, Cryotherapy, Moist heat, Traction, Ultrasound, and Manual therapy  PLAN FOR NEXT SESSION: Russian to anterior tib; knee ROM; SLR; TKE; heel raise; standing march; begin stair training when able.    Lorayne Bender PT DPT  08/29/2022, 2:42 PM

## 2022-08-30 ENCOUNTER — Encounter: Payer: Self-pay | Admitting: Orthopaedic Surgery

## 2022-08-30 ENCOUNTER — Ambulatory Visit (INDEPENDENT_AMBULATORY_CARE_PROVIDER_SITE_OTHER): Payer: BC Managed Care – PPO | Admitting: Orthopaedic Surgery

## 2022-08-30 DIAGNOSIS — Z96651 Presence of right artificial knee joint: Secondary | ICD-10-CM

## 2022-08-30 MED ORDER — PREGABALIN 75 MG PO CAPS
75.0000 mg | ORAL_CAPSULE | Freq: Two times a day (BID) | ORAL | 0 refills | Status: DC
Start: 2022-08-30 — End: 2022-09-28

## 2022-08-30 NOTE — Progress Notes (Signed)
The patient continues to recover status post a right total knee arthroplasty.  She has had a very tough postoperative course with foot drop combined with a DVT.  She is on Xarelto.  She is off of pain medications.  On exam her range of motion is incredibly good and full.  The knee feels limply stable.  There are 2 small areas of retained suture that are removed and we will have her treat with Bactroban ointment daily.  She still shows significant evidence of foot drop.  We have tried Neurontin but she did not like the way that made her feel.  She does not like to have bed sheets touch her feet at night and that is certainly normal when someone is dealing with postoperative course after joint replacement surgery and the fact that she has deficits as a relates to the nerve injury and foot drop.  This likely came from retractor placement as opposed to the tourniquet.  Fortunately we usually do eventually see recovery from foot drop.  Right now she is not showing any recovery at all from the motor aspect of things.  She will likely be on Xarelto for 3 full months.  The next ultrasound to assess for her blood clots will be in November.  I would like to see her back though in 4 weeks to see how she is doing overall.  I would also like to try Lyrica to see how she responds to that from a nerve standpoint.  All questions and concerns were answered and addressed.

## 2022-08-31 ENCOUNTER — Encounter (HOSPITAL_BASED_OUTPATIENT_CLINIC_OR_DEPARTMENT_OTHER): Payer: Self-pay | Admitting: Physical Therapy

## 2022-08-31 ENCOUNTER — Ambulatory Visit (HOSPITAL_BASED_OUTPATIENT_CLINIC_OR_DEPARTMENT_OTHER): Payer: BC Managed Care – PPO | Admitting: Physical Therapy

## 2022-08-31 DIAGNOSIS — M6281 Muscle weakness (generalized): Secondary | ICD-10-CM

## 2022-08-31 DIAGNOSIS — M25561 Pain in right knee: Secondary | ICD-10-CM

## 2022-08-31 DIAGNOSIS — R600 Localized edema: Secondary | ICD-10-CM

## 2022-08-31 DIAGNOSIS — M25661 Stiffness of right knee, not elsewhere classified: Secondary | ICD-10-CM

## 2022-08-31 DIAGNOSIS — R2689 Other abnormalities of gait and mobility: Secondary | ICD-10-CM

## 2022-08-31 NOTE — Therapy (Signed)
OUTPATIENT PHYSICAL THERAPY LOWER EXTREMITY TREATMENT/Progress Note   Patient Name: Virginia Fischer MRN: [id number] DOB:12-30-1955, 66 y.o., female Today's Date: 08/31/2022   PT End of Session - 08/31/22 0907     Visit Number 10    Number of Visits 16    Date for PT Re-Evaluation 09/27/22    PT Start Time 0847    PT Stop Time 0930    PT Time Calculation (min) 43 min    Activity Tolerance Patient tolerated treatment well    Behavior During Therapy [facility] for tasks assessed/performed            Progress Note Reporting Period 08/02/2022 to 08/31/2022  See note below for Objective Data and Assessment of Progress/Goals.         Past Medical History:  Diagnosis Date   Arthritis    Past Surgical History:  Procedure Laterality Date   CESAREAN SECTION  1995   TOTAL KNEE ARTHROPLASTY Right 07/14/2022   Procedure: RIGHT TOTAL KNEE ARTHROPLASTY;  Surgeon: Kathryne Hitch, MD;  Location: [facility];  Service: Orthopedics;  Laterality: Right;   Patient Active Problem List   Diagnosis Date Noted   Status post total right knee replacement 07/14/2022   Unilateral primary osteoarthritis, right knee 07/13/2022    PCP: Dr Jarrett Soho   REFERRING PROVIDER: Dr Doneen Poisson  REFERRING DIAG: R TKA   THERAPY DIAG:  Acute pain of right knee  Other abnormalities of gait and mobility  Stiffness of right knee, not elsewhere classified  Muscle weakness (generalized)  Localized edema  Rationale for Evaluation and Treatment Rehabilitation  ONSET DATE: 7/28  SUBJECTIVE:   SUBJECTIVE STATEMENT: The patient has seen the MD. He is happy with her progress.   PERTINENT HISTORY: Right Knee OA  PAIN:  Are you having pain? Yes: NPRS scale:4-5 /10  Pain location: back is better, most pain is in the leg  Pain description: aching, feels tight  Aggravating factors: standing, walking Relieving factors: tylenol   PRECAUTIONS: None  WEIGHT BEARING RESTRICTIONS  No  FALLS:  Has patient fallen in last 6 months? 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   EDEMA:  Circumferential: 56.7 46.3   POSTURE: No Significant postural limitations  PALPATION: No unexpected tenderness to palpation   LOWER EXTREMITY ROM:  Active ROM Right eval Left eval Right  9/14  Hip flexion     Hip extension     Hip abduction     Hip adduction     Hip internal rotation     Hip external rotation     Knee flexion 103  125  Knee extension -6  0  Ankle dorsiflexion     Ankle plantarflexion     Ankle inversion     Ankle eversion      (Blank rows = not tested)  Passive ROM Right eval Left eval Right   Hip flexion     Hip extension     Hip abduction     Hip adduction     Hip internal rotation  Hip external rotation     Knee flexion 110  120  Knee extension -5  -3  Ankle dorsiflexion     Ankle plantarflexion     Ankle inversion     Ankle eversion      (Blank rows = not tested)  LOWER EXTREMITY MMT:  MMT Right eval Left eval  Hip flexion 4+ 5  Hip extension    Hip abduction 4 5  Hip adduction    Hip internal rotation    Hip external rotation    Knee flexion    Knee extension 3+ 5  Ankle dorsiflexion 1 5  Ankle plantarflexion    Ankle inversion    Ankle eversion     (Blank rows = not tested)  FUNCTIONAL TESTS:  Mild use of hands for sit to stand   GAIT: Significant foot drop with ambulation    TODAY'S TREATMENT: 09/14 Nustep L5x6 minutes BLEs only Forward and lateral step ups 6 inch box x15 R LE  Step down 2x10 4 inch    SLR 2x10 1.5 lbs  Bridge 2x15 SAQ 2x15 5 lbs  LAQ 3x10 5 lbs   Standing heel raise to church pew x20   9/12 Nustep L5x6 minutes BLEs only Forward and lateral step ups 6 inch box x15 R LE   SLR 2x10 1.5 lbs  Bridge 2x15 SAQ 2x15 5 lbs  LAQ 3x10 5 lbs   Standing heel raise to church pew x20    [place] E-stim supine ankle DF, started in standing and transitioned to seated due to fatigue 30 mA, 10/20 cycle, 80 bps freq, 5s ramp, pad along length of Tib ant and fibularis group Trace contraction noted, 8 min   08/25/22  Nustep L5x6 minutes BLEs only   LAQs 6# 2x10 2 second holds  SAQs 6# 2x10 2 seconds  Education on use of foot up brace to normalize gait mechanics and reduce circumduction/high stepping pattern and possible impacts on low back or hip pain; education on quad mechanics and eccentric vs concentric mm action/impact functionally   Bridges 1x15 red TB  Sidelying clamshells red TB x10 B Forward and lateral step ups 6 inch box x15 R LE             Step training (reciprocal) with close S; poor eccentric quad control noted on 6 inch steps            Forward step downs with heel tap 2x10 R LE    9/6  Nustep x6 minutes L4 BLEs only  Education on possible use of brace/AFO for foot drop down the line- would avoid pressure on the bruises on the back of her calf until they heal, also discussed foot up brace which would avoid this issue entirely and allow her to establish more normal gait pattern, education on tennis ball massage to proximal quad and hip flexor but stop if pain increases   SAQs 5# 2x10 3 second holds LAQs 5# 2x10  1 second hold STS weight midline with eccentric lower x15  Forward and lateral step ups 6 inch box x10 each R LE Forward step downs 4 inch box 2x10     8/31 [place] E-stim supine ankle DF, started in standing and transitioned to seated due to fatigue 30 mA, 10/20 cycle, 80 bps freq, 5s ramp, pad along length of Tib ant and fibularis  group Trace contraction noted, 8 min   Quad set 2x10  SAQ 3x10  SLR 2x10   Step up 2x10 4  inch  Side step x10 4 inch   Manual: PROM into knee flexion and extension  Nu-step 5 min L3 to tolerance   Tennis ball trigger point release  Gluteal stretch on the left 2x30 sec hold supine but reviewed how to do in sitting    8/29 [place] E-stim supine ankle DF, started in standing and transitioned to seated due to fatigue 30 mA, 10/20 cycle, 80 bps freq, 5s ramp, pad along length of Tib ant and fibularis group Trace contraction noted, 8 min   Quad set 2x10  SAQ 3x10  SLR 2x10   Step up 2x10 4 inch  Side step x10 4 inch   Manual: PROM into knee flexion and extension  Nu-step 5 min L3 to tolerance   8/24 [place] E-stim supine ankle DF, started in standing and transitioned to seated due to fatigue 30 mA, 10/20 cycle, 80 bps freq, 5s ramp, pad along length of Tib ant and fibularis group Trace contraction noted, 8 min   Quad set 2x10  SAQ 3x10  SLR 2x10   Standing weight shift fwd and back x20  Standing march 2x10   Manual: PROM into knee flexion and extension  Nu-step 5 min L3 to tolerance     PATIENT EDUCATION:  Education details: HEP, symptom management; progression of activity  Person educated: Patient and Spouse Education method: Explanation, Demonstration, Tactile cues, Verbal cues, and Handouts Education comprehension: verbalized understanding, returned demonstration, verbal cues required, tactile cues required, and needs further education   HOME EXERCISE PROGRAM:  [id number] + initial HEP given at eval (code not in chart)  ASSESSMENT:  CLINICAL IMPRESSION: The patient continues to do very well. She is still noticing minimal improvement in her DF. The lyrica has helped the nerve pain. We attempted the e-stim again today. We also worked on step downs. She feels like that is her biggest difficulty at this time. She was assured that this is the last thing that  comes back and she is ahead of schedule on everything else. We will continue to progress as tolerated.   OBJECTIVE IMPAIRMENTS Abnormal gait, decreased activity tolerance, decreased endurance, decreased mobility, difficulty walking, decreased ROM, decreased strength, and pain.   ACTIVITY LIMITATIONS bending, standing, squatting, sleeping, stairs, transfers, bed mobility, dressing, and locomotion level  PARTICIPATION LIMITATIONS: meal prep, cleaning, laundry, driving, occupation, yard work, and church  PERSONAL FACTORS Time since onset of injury/illness/exacerbation are also affecting patient's functional outcome.   REHAB POTENTIAL: Excellent  CLINICAL DECISION MAKING: Evolving/moderate complexity moderate complexity 2nd to foot drop   EVALUATION COMPLEXITY: Low   GOALS: Goals reviewed with patient? Yes  SHORT TERM GOALS: Target date: 08/23/2022 8/31 Patient will increase right knee flexion to 120 degrees  Baseline: Goal status: progressing   2.  Patient will demonstrate 3/5 right ankle DF strength  Baseline:  Goal status: still only trace firing   3.  Patient will demonstrate full knee extension  Baseline:  Goal status: Improving    LONG TERM GOALS: Target date: 09/13/2022   Patient will walk community distances without pain in order to return to her job  Baseline:  Goal status: INITIAL  2.  Patient will go up/down 6 steps without pain  Baseline:  Goal status: INITIAL  3.  Patient will be independent with complete HEP  Baseline:    PLAN: PT FREQUENCY: 2x/week  PT DURATION: 8 weeks  PLANNED INTERVENTIONS: Therapeutic exercises, Therapeutic activity, Neuromuscular re-education, Balance training, Gait training, Patient/Family education, Self Care, Joint mobilization, Stair training,  DME instructions, Aquatic Therapy, Dry Needling, Electrical stimulation, Cryotherapy, Moist heat, Traction, Ultrasound, and Manual therapy  PLAN FOR NEXT SESSION: Russian to anterior tib;  knee ROM; SLR; TKE; heel raise; standing march; begin stair training when able.    Lorayne Bender PT DPT  08/31/2022, 9:08 AM

## 2022-09-05 ENCOUNTER — Ambulatory Visit (HOSPITAL_BASED_OUTPATIENT_CLINIC_OR_DEPARTMENT_OTHER): Payer: BC Managed Care – PPO | Admitting: Physical Therapy

## 2022-09-05 DIAGNOSIS — M25661 Stiffness of right knee, not elsewhere classified: Secondary | ICD-10-CM

## 2022-09-05 DIAGNOSIS — R600 Localized edema: Secondary | ICD-10-CM

## 2022-09-05 DIAGNOSIS — R2689 Other abnormalities of gait and mobility: Secondary | ICD-10-CM

## 2022-09-05 DIAGNOSIS — M25561 Pain in right knee: Secondary | ICD-10-CM

## 2022-09-05 DIAGNOSIS — M6281 Muscle weakness (generalized): Secondary | ICD-10-CM

## 2022-09-05 NOTE — Therapy (Signed)
OUTPATIENT PHYSICAL THERAPY LOWER EXTREMITY TREATMENT/Progress Note   Patient Name: Virginia Fischer MRN: [id number] DOB:04-09-1956, 66 y.o., female Today's Date: 09/05/2022    Progress Note Reporting Period 08/02/2022 to 08/31/2022  See note below for Objective Data and Assessment of Progress/Goals.         Past Medical History:  Diagnosis Date   Arthritis    Past Surgical History:  Procedure Laterality Date   CESAREAN SECTION  1995   TOTAL KNEE ARTHROPLASTY Right 07/14/2022   Procedure: RIGHT TOTAL KNEE ARTHROPLASTY;  Surgeon: Kathryne Hitch, MD;  Location: [facility];  Service: Orthopedics;  Laterality: Right;   Patient Active Problem List   Diagnosis Date Noted   Status post total right knee replacement 07/14/2022   Unilateral primary osteoarthritis, right knee 07/13/2022    PCP: Dr Jarrett Soho   REFERRING PROVIDER: Dr Doneen Poisson  REFERRING DIAG: R TKA   THERAPY DIAG:  No diagnosis found.  Rationale for Evaluation and Treatment Rehabilitation  ONSET DATE: 7/28  SUBJECTIVE:   SUBJECTIVE STATEMENT: The patient has seen the MD. He is happy with her progress.   PERTINENT HISTORY: Right Knee OA  PAIN:  Are you having pain? Yes: NPRS scale:4-5 /10  Pain location: back is better, most pain is in the leg  Pain description: aching, feels tight  Aggravating factors: standing, walking Relieving factors: tylenol   PRECAUTIONS: None  WEIGHT BEARING RESTRICTIONS No  FALLS:  Has patient fallen in last 6 months? No  LIVING ENVIRONMENT: 4 steps into the house; Steps to get tot he bedroom 12 OCCUPATION:  Retired  Presenter, broadcasting: Art gallery manager tours at an [employer]   PLOF: Independent  PATIENT GOALS  To get back to regular life   OBJECTIVE:   DIAGNOSTIC FINDINGS:    PATIENT SURVEYS:  FOTO 59% this visit 61% expected   COGNITION:  Overall cognitive status: Within functional limits for tasks assessed     SENSATION: Sensation coming  back in the foot.   EDEMA:  Circumferential: 56.7 46.3   POSTURE: No Significant postural limitations  PALPATION: No unexpected tenderness to palpation   LOWER EXTREMITY ROM:  Active ROM Right eval Left eval Right  9/14  Hip flexion     Hip extension     Hip abduction     Hip adduction     Hip internal rotation     Hip external rotation     Knee flexion 103  125  Knee extension -6  0  Ankle dorsiflexion     Ankle plantarflexion     Ankle inversion     Ankle eversion      (Blank rows = not tested)  Passive ROM Right eval Left eval Right   Hip flexion     Hip extension     Hip abduction     Hip adduction     Hip internal rotation     Hip external rotation     Knee flexion 110  120  Knee extension -5  -3  Ankle dorsiflexion     Ankle plantarflexion     Ankle inversion     Ankle eversion      (Blank rows = not tested)  LOWER EXTREMITY MMT:  MMT Right eval Left eval  Hip flexion 4+ 5  Hip extension    Hip abduction 4 5  Hip adduction    Hip internal rotation    Hip external rotation    Knee flexion    Knee extension 3+ 5  Ankle  dorsiflexion 1 5  Ankle plantarflexion    Ankle inversion    Ankle eversion     (Blank rows = not tested)  FUNCTIONAL TESTS:  Mild use of hands for sit to stand   GAIT: Significant foot drop with ambulation    TODAY'S TREATMENT: 9/19 Nustep L5x6 minutes BLEs only SLR 2x10 1.5 lbs  SAQ 2x15 5 lbs   Standing heel raise to church pew x20  Assisted DF 2x15 appears ot have some type of activation    09/14 Nustep L5x6 minutes BLEs only Forward and lateral step ups 6 inch box x15 R LE  Step down 2x10 4 inch   SLR 2x10 1.5 lbs  Bridge 2x15 SAQ 2x15 5 lbs  LAQ 3x10 5 lbs   Standing heel raise to church pew x20  PATIENT EDUCATION:  Education details: HEP, symptom management; progression of activity  Person educated: Patient and Spouse Education method: Explanation, Demonstration, Tactile cues, Verbal cues, and  Handouts Education comprehension: verbalized understanding, returned demonstration, verbal cues required, tactile cues required, and needs further education   HOME EXERCISE PROGRAM:  [id number] + initial HEP given at eval (code not in chart)  ASSESSMENT:  CLINICAL IMPRESSION: The patients knee strength continues to improve. She had no significant pain with treatment. She appeared to have some type of DF activation today in a supine position. We also worked on step downs. She did very well with step downs. We will continue to progress as tolerated.  OBJECTIVE IMPAIRMENTS Abnormal gait, decreased activity tolerance, decreased endurance, decreased mobility, difficulty walking, decreased ROM, decreased strength, and pain.   ACTIVITY LIMITATIONS bending, standing, squatting, sleeping, stairs, transfers, bed mobility, dressing, and locomotion level  PARTICIPATION LIMITATIONS: meal prep, cleaning, laundry, driving, occupation, yard work, and church  PERSONAL FACTORS Time since onset of injury/illness/exacerbation are also affecting patient's functional outcome.   REHAB POTENTIAL: Excellent  CLINICAL DECISION MAKING: Evolving/moderate complexity moderate complexity 2nd to foot drop   EVALUATION COMPLEXITY: Low   GOALS: Goals reviewed with patient? Yes  SHORT TERM GOALS: Target date: 08/23/2022 8/31 Patient will increase right knee flexion to 120 degrees  Baseline: Goal status: progressing   2.  Patient will demonstrate 3/5 right ankle DF strength  Baseline:  Goal status: still only trace firing   3.  Patient will demonstrate full knee extension  Baseline:  Goal status: Improving    LONG TERM GOALS: Target date: 09/13/2022   Patient will walk community distances without pain in order to return to her job  Baseline:  Goal status: INITIAL  2.  Patient will go up/down 6 steps without pain  Baseline:  Goal status: INITIAL  3.  Patient will be independent with complete HEP   Baseline:    PLAN: PT FREQUENCY: 2x/week  PT DURATION: 8 weeks  PLANNED INTERVENTIONS: Therapeutic exercises, Therapeutic activity, Neuromuscular re-education, Balance training, Gait training, Patient/Family education, Self Care, Joint mobilization, Stair training, DME instructions, Aquatic Therapy, Dry Needling, Electrical stimulation, Cryotherapy, Moist heat, Traction, Ultrasound, and Manual therapy  PLAN FOR NEXT SESSION: [place] to anterior tib; knee ROM; SLR; TKE; heel raise; standing march; begin stair training when able.    Carolyne Littles PT DPT  09/05/2022, 12:02 PM

## 2022-09-07 ENCOUNTER — Encounter (HOSPITAL_BASED_OUTPATIENT_CLINIC_OR_DEPARTMENT_OTHER): Payer: Self-pay | Admitting: Physical Therapy

## 2022-09-07 ENCOUNTER — Ambulatory Visit (HOSPITAL_BASED_OUTPATIENT_CLINIC_OR_DEPARTMENT_OTHER): Payer: BC Managed Care – PPO | Admitting: Physical Therapy

## 2022-09-07 DIAGNOSIS — M25661 Stiffness of right knee, not elsewhere classified: Secondary | ICD-10-CM

## 2022-09-07 DIAGNOSIS — R2689 Other abnormalities of gait and mobility: Secondary | ICD-10-CM

## 2022-09-07 DIAGNOSIS — R600 Localized edema: Secondary | ICD-10-CM

## 2022-09-07 DIAGNOSIS — M25561 Pain in right knee: Secondary | ICD-10-CM

## 2022-09-07 DIAGNOSIS — M6281 Muscle weakness (generalized): Secondary | ICD-10-CM

## 2022-09-07 NOTE — Therapy (Signed)
OUTPATIENT PHYSICAL THERAPY LOWER EXTREMITY TREATMENT/Progress Note   Patient Name: Virginia Fischer MRN: [id number] DOB:[birth year], 66 y.o., female Today's Date: 09/07/2022    Progress Note Reporting Period 08/02/2022 to 08/31/2022  See note below for Objective Data and Assessment of Progress/Goals.         Past Medical History:  Diagnosis Date   Arthritis    Past Surgical History:  Procedure Laterality Date   CESAREAN SECTION  1995   TOTAL KNEE ARTHROPLASTY Right 07/14/2022   Procedure: RIGHT TOTAL KNEE ARTHROPLASTY;  Surgeon: Kathryne Hitch, MD;  Location: [facility];  Service: Orthopedics;  Laterality: Right;   Patient Active Problem List   Diagnosis Date Noted   Status post total right knee replacement 07/14/2022   Unilateral primary osteoarthritis, right knee 07/13/2022    PCP: Dr Jarrett Soho   REFERRING PROVIDER: Dr Doneen Poisson  REFERRING DIAG: R TKA   THERAPY DIAG:  No diagnosis found.  Rationale for Evaluation and Treatment Rehabilitation  ONSET DATE: 7/28  SUBJECTIVE:   SUBJECTIVE STATEMENT: The patient was sore over the past 2 days. She reports she is most sore at night. She is having some soreness right now.   PERTINENT HISTORY: Right Knee OA  PAIN:  Are you having pain? Yes: NPRS scale:4-5 /10  Pain location: back is better, most pain is in the leg  Pain description: aching, feels tight  Aggravating factors: standing, walking Relieving factors: tylenol   PRECAUTIONS: None  WEIGHT BEARING RESTRICTIONS No  FALLS:  Has patient fallen in last 6 months? 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   EDEMA:  Circumferential: 56.7 46.3   POSTURE: No Significant postural limitations  PALPATION: No unexpected tenderness to palpation   LOWER EXTREMITY ROM:  Active ROM Right eval Left eval Right  9/14  Hip flexion     Hip extension     Hip abduction     Hip adduction     Hip internal rotation     Hip external rotation     Knee flexion 103  125  Knee extension -6  0  Ankle dorsiflexion     Ankle plantarflexion     Ankle inversion     Ankle eversion      (Blank rows = not tested)  Passive ROM Right eval Left eval Right   Hip flexion     Hip extension     Hip abduction     Hip adduction     Hip internal rotation     Hip external rotation     Knee flexion 110  120  Knee extension -5  -3  Ankle dorsiflexion     Ankle plantarflexion     Ankle inversion     Ankle eversion      (Blank rows = not tested)  LOWER EXTREMITY MMT:  MMT Right eval Left eval  Hip flexion 4+ 5  Hip extension    Hip abduction 4 5  Hip adduction    Hip internal rotation    Hip external rotation  Knee flexion    Knee extension 3+ 5  Ankle dorsiflexion 1 5  Ankle plantarflexion    Ankle inversion    Ankle eversion     (Blank rows = not tested)  FUNCTIONAL TESTS:  Mild use of hands for sit to stand   GAIT: Significant foot drop with ambulation    TODAY'S TREATMENT: 9/21  Nustep L5x6 minutes BLEs only  SLR 2x10 1.5 lbs  Bridge 2x15 SAQ 2x15 5 lbs  LAQ 3x10 5 lbs   Manual: Patella mobilization; garde II and III PA and AP mobilization; trigger point release to posterior knee hamstring area  Standing heel rasie x20  Standing march 2x15   9/19 Nustep L5x6 minutes BLEs only SLR 2x10 1.5 lbs  SAQ 2x15 5 lbs   Standing heel raise to church pew x20  Assisted DF 2x15 appears ot have some type of activation    09/14 Nustep L5x6 minutes BLEs only Forward and lateral step ups 6 inch box  x15 R LE  Step down 2x10 4 inch   SLR 2x10 1.5 lbs  Bridge 2x15 SAQ 2x15 5 lbs  LAQ 3x10 5 lbs   Standing heel raise to church pew x20  PATIENT EDUCATION:  Education details: HEP, symptom management; progression of activity  Person educated: Patient and Spouse Education method: Explanation, Demonstration, Tactile cues, Verbal cues, and Handouts Education comprehension: verbalized understanding, returned demonstration, verbal cues required, tactile cues required, and needs further education   HOME EXERCISE PROGRAM:  [id number] + initial HEP given at eval (code not in chart)  ASSESSMENT:  CLINICAL IMPRESSION: The patient was having more pain today. We focused more on mobility of the knee cap and light exercises. We also focused on light exercises. She reported it felt better after. She was advised to work on light home exercises. She was advised to take it easy over the weekend and ice at night. We will resume functional training next visit if able.   OBJECTIVE IMPAIRMENTS Abnormal gait, decreased activity tolerance, decreased endurance, decreased mobility, difficulty walking, decreased ROM, decreased strength, and pain.   ACTIVITY LIMITATIONS bending, standing, squatting, sleeping, stairs, transfers, bed mobility, dressing, and locomotion level  PARTICIPATION LIMITATIONS: meal prep, cleaning, laundry, driving, occupation, yard work, and church  PERSONAL FACTORS Time since onset of injury/illness/exacerbation are also affecting patient's functional outcome.   REHAB POTENTIAL: Excellent  CLINICAL DECISION MAKING: Evolving/moderate complexity moderate complexity 2nd to foot drop   EVALUATION COMPLEXITY: Low   GOALS: Goals reviewed with patient? Yes  SHORT TERM GOALS: Target date: 08/23/2022 8/31 Patient will increase right knee flexion to 120 degrees  Baseline: Goal status: progressing   2.  Patient will demonstrate 3/5 right ankle DF strength  Baseline:  Goal status: still  only trace firing   3.  Patient will demonstrate full knee extension  Baseline:  Goal status: Improving    LONG TERM GOALS: Target date: 09/13/2022   Patient will walk community distances without pain in order to return to her job  Baseline:  Goal status: INITIAL  2.  Patient will go up/down 6 steps without pain  Baseline:  Goal status: INITIAL  3.  Patient will be independent with complete HEP  Baseline:    PLAN: PT FREQUENCY: 2x/week  PT DURATION: 8 weeks  PLANNED INTERVENTIONS: Therapeutic exercises, Therapeutic activity, Neuromuscular re-education, Balance training, Gait training, Patient/Family education, Self Care, Joint mobilization, Stair training, DME instructions, Aquatic Therapy, Dry Needling, Electrical stimulation, Cryotherapy, Moist heat, Traction, Ultrasound, and Manual therapy  PLAN  FOR NEXT SESSION: [place] to anterior tib; knee ROM; SLR; TKE; heel raise; standing march; begin stair training when able.    Carolyne Littles PT DPT  09/07/2022, 11:20 AM

## 2022-09-12 ENCOUNTER — Encounter (HOSPITAL_BASED_OUTPATIENT_CLINIC_OR_DEPARTMENT_OTHER): Payer: Self-pay | Admitting: Physical Therapy

## 2022-09-12 ENCOUNTER — Ambulatory Visit (HOSPITAL_BASED_OUTPATIENT_CLINIC_OR_DEPARTMENT_OTHER): Payer: BC Managed Care – PPO | Admitting: Physical Therapy

## 2022-09-12 DIAGNOSIS — R600 Localized edema: Secondary | ICD-10-CM

## 2022-09-12 DIAGNOSIS — M25661 Stiffness of right knee, not elsewhere classified: Secondary | ICD-10-CM

## 2022-09-12 DIAGNOSIS — M6281 Muscle weakness (generalized): Secondary | ICD-10-CM

## 2022-09-12 DIAGNOSIS — R2689 Other abnormalities of gait and mobility: Secondary | ICD-10-CM

## 2022-09-12 DIAGNOSIS — M25561 Pain in right knee: Secondary | ICD-10-CM | POA: Diagnosis not present

## 2022-09-12 NOTE — Therapy (Signed)
OUTPATIENT PHYSICAL THERAPY LOWER EXTREMITY TREATMENT   Patient Name: Virginia Fischer MRN: [id number] DOB:12-15-1956, 66 y.o., female Today's Date: 09/12/2022   PT End of Session - 09/12/22 1104     Visit Number 13    Number of Visits 16    Date for PT Re-Evaluation 09/27/22    PT Start Time 1100    PT Stop Time 1143    PT Time Calculation (min) 43 min    Activity Tolerance Patient tolerated treatment well    Behavior During Therapy [facility] for tasks assessed/performed             Progress Note Reporting Period 08/02/2022 to 08/31/2022  See note below for Objective Data and Assessment of Progress/Goals.         Past Medical History:  Diagnosis Date   Arthritis    Past Surgical History:  Procedure Laterality Date   CESAREAN SECTION  1995   TOTAL KNEE ARTHROPLASTY Right 07/14/2022   Procedure: RIGHT TOTAL KNEE ARTHROPLASTY;  Surgeon: Mcarthur Rossetti, MD;  Location: [facility];  Service: Orthopedics;  Laterality: Right;   Patient Active Problem List   Diagnosis Date Noted   Status post total right knee replacement 07/14/2022   Unilateral primary osteoarthritis, right knee 07/13/2022    PCP: Dr Marda Stalker   REFERRING PROVIDER: Dr Jean Rosenthal  REFERRING DIAG: R TKA   THERAPY DIAG:  Acute pain of right knee  Other abnormalities of gait and mobility  Stiffness of right knee, not elsewhere classified  Muscle weakness (generalized)  Localized edema  Rationale for Evaluation and Treatment Rehabilitation  ONSET DATE: 7/28  SUBJECTIVE:   SUBJECTIVE STATEMENT: The patient's knee has been feeling better the past few days.    PERTINENT HISTORY: Right Knee OA  PAIN:  Are you having pain? Yes: NPRS scale:2/10  Pain location: back is better, most pain is in the leg  Pain description: aching, feels tight  Aggravating factors: standing, walking Relieving factors: tylenol   PRECAUTIONS: None  WEIGHT BEARING RESTRICTIONS No  FALLS:   Has patient fallen in last 6 months? 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   EDEMA:  Circumferential: 56.7 46.3   POSTURE: No Significant postural limitations  PALPATION: No unexpected tenderness to palpation   LOWER EXTREMITY ROM:  Active ROM Right eval Left eval Right  9/14  Hip flexion     Hip extension     Hip abduction     Hip adduction     Hip internal rotation     Hip external rotation     Knee flexion 103  125  Knee extension -6  0  Ankle dorsiflexion     Ankle plantarflexion     Ankle inversion     Ankle eversion      (Blank rows = not tested)  Passive ROM Right eval Left eval Right   Hip flexion     Hip extension     Hip abduction     Hip adduction     Hip internal rotation  Hip external rotation     Knee flexion 110  120  Knee extension -5  -3  Ankle dorsiflexion     Ankle plantarflexion     Ankle inversion     Ankle eversion      (Blank rows = not tested)  LOWER EXTREMITY MMT:  MMT Right eval Left eval  Hip flexion 4+ 5  Hip extension    Hip abduction 4 5  Hip adduction    Hip internal rotation    Hip external rotation    Knee flexion    Knee extension 3+ 5  Ankle dorsiflexion 1 5  Ankle plantarflexion    Ankle inversion    Ankle eversion     (Blank rows = not tested)  FUNCTIONAL TESTS:  Mild use of hands for sit to stand   GAIT: Significant foot drop with ambulation    TODAY'S TREATMENT: 09/26 Nustep L5x6 minutes BLEs only Forward and lateral step ups 6 inch box x15 R LE  Step down 2x10 4 inch   SLR 2x10 1.5  lbs  Bridge 2x15 SAQ 2x15 5 lbs  LAQ 3x10 5 lbs   Standing heel raise to church pew x20    9/21  Nustep L5x6 minutes BLEs only Forward and lateral step ups 6 inch box x15 R LE  Step down 2x10 4 inch   SLR 2x10 1.5 lbs  Bridge 2x15 SAQ 2x15 5 lbs  LAQ 3x10 5 lbs   Standing heel raise to church pew x20    9/19 Nustep L5x6 minutes BLEs only SLR 2x10 1.5 lbs  SAQ 2x15 5 lbs   Standing heel raise to church pew x20  Assisted DF 2x15 appears ot have some type of activation    09/14 Nustep L5x6 minutes BLEs only Forward and lateral step ups 6 inch box x15 R LE  Step down 2x10 4 inch   SLR 2x10 1.5 lbs  Bridge 2x15 SAQ 2x15 5 lbs  LAQ 3x10 5 lbs   Standing heel raise to church pew x20  PATIENT EDUCATION:  Education details: HEP, symptom management; progression of activity  Person educated: Patient and Spouse Education method: Explanation, Demonstration, Tactile cues, Verbal cues, and Handouts Education comprehension: verbalized understanding, returned demonstration, verbal cues required, tactile cues required, and needs further education   HOME EXERCISE PROGRAM:  [id number] + initial HEP given at eval (code not in chart)  ASSESSMENT:  CLINICAL IMPRESSION: The patients pain level was back to a normal level. We focused on manual patella movement today. She does appear to have an area in her lateral inferior knee that is still bound down. We worked on that today and instructed her to work at home. We worked on step upas and step downs today. She had no significant pain. Therapy will continue to progress as tolerated.  OBJECTIVE IMPAIRMENTS Abnormal gait, decreased activity tolerance, decreased endurance, decreased mobility, difficulty walking, decreased ROM, decreased strength, and pain.   ACTIVITY LIMITATIONS bending, standing, squatting, sleeping, stairs, transfers, bed mobility, dressing, and locomotion level  PARTICIPATION LIMITATIONS: meal prep, cleaning, laundry,  driving, occupation, yard work, and church  PERSONAL FACTORS Time since onset of injury/illness/exacerbation are also affecting patient's functional outcome.   REHAB POTENTIAL: Excellent  CLINICAL DECISION MAKING: Evolving/moderate complexity moderate complexity 2nd to foot drop   EVALUATION COMPLEXITY: Low   GOALS: Goals reviewed with patient? Yes  SHORT TERM GOALS: Target date: 08/23/2022 8/31 Patient will increase right knee flexion to 120 degrees  Baseline: Goal status: progressing  2.  Patient will demonstrate 3/5 right ankle DF strength  Baseline:  Goal status: still only trace firing   3.  Patient will demonstrate full knee extension  Baseline:  Goal status: Improving    LONG TERM GOALS: Target date: 09/13/2022   Patient will walk community distances without pain in order to return to her job  Baseline:  Goal status: INITIAL  2.  Patient will go up/down 6 steps without pain  Baseline:  Goal status: INITIAL  3.  Patient will be independent with complete HEP  Baseline:    PLAN: PT FREQUENCY: 2x/week  PT DURATION: 8 weeks  PLANNED INTERVENTIONS: Therapeutic exercises, Therapeutic activity, Neuromuscular re-education, Balance training, Gait training, Patient/Family education, Self Care, Joint mobilization, Stair training, DME instructions, Aquatic Therapy, Dry Needling, Electrical stimulation, Cryotherapy, Moist heat, Traction, Ultrasound, and Manual therapy  PLAN FOR NEXT SESSION: [place] to anterior tib; knee ROM; SLR; TKE; heel raise; standing march; begin stair training when able.    Carolyne Littles PT DPT  09/12/2022, 11:06 AM

## 2022-09-14 ENCOUNTER — Ambulatory Visit (HOSPITAL_BASED_OUTPATIENT_CLINIC_OR_DEPARTMENT_OTHER): Payer: BC Managed Care – PPO | Admitting: Physical Therapy

## 2022-09-14 ENCOUNTER — Encounter (HOSPITAL_BASED_OUTPATIENT_CLINIC_OR_DEPARTMENT_OTHER): Payer: Self-pay | Admitting: Physical Therapy

## 2022-09-14 DIAGNOSIS — M25561 Pain in right knee: Secondary | ICD-10-CM | POA: Diagnosis not present

## 2022-09-14 DIAGNOSIS — R2689 Other abnormalities of gait and mobility: Secondary | ICD-10-CM

## 2022-09-14 DIAGNOSIS — M25661 Stiffness of right knee, not elsewhere classified: Secondary | ICD-10-CM

## 2022-09-14 DIAGNOSIS — M6281 Muscle weakness (generalized): Secondary | ICD-10-CM

## 2022-09-14 DIAGNOSIS — R600 Localized edema: Secondary | ICD-10-CM

## 2022-09-14 NOTE — Therapy (Signed)
OUTPATIENT PHYSICAL THERAPY LOWER EXTREMITY TREATMENT   Patient Name: Virginia Fischer MRN: [id number] DOB:1956-09-08, 66 y.o., female Today's Date: 09/14/2022     Progress Note Reporting Period 08/02/2022 to 08/31/2022  See note below for Objective Data and Assessment of Progress/Goals.         Past Medical History:  Diagnosis Date   Arthritis    Past Surgical History:  Procedure Laterality Date   CESAREAN SECTION  1995   TOTAL KNEE ARTHROPLASTY Right 07/14/2022   Procedure: RIGHT TOTAL KNEE ARTHROPLASTY;  Surgeon: Kathryne Hitch, MD;  Location: [facility];  Service: Orthopedics;  Laterality: Right;   Patient Active Problem List   Diagnosis Date Noted   Status post total right knee replacement 07/14/2022   Unilateral primary osteoarthritis, right knee 07/13/2022    PCP: Dr Jarrett Soho   REFERRING PROVIDER: Dr Doneen Poisson  REFERRING DIAG: R TKA   THERAPY DIAG:  No diagnosis found.  Rationale for Evaluation and Treatment Rehabilitation  ONSET DATE: 7/28  SUBJECTIVE:   SUBJECTIVE STATEMENT: The patient's knee has been feeling better the past few days.    PERTINENT HISTORY: Right Knee OA  PAIN:  Are you having pain? Yes: NPRS scale:2/10  Pain location: back is better, most pain is in the leg  Pain description: aching, feels tight  Aggravating factors: standing, walking Relieving factors: tylenol   PRECAUTIONS: None  WEIGHT BEARING RESTRICTIONS No  FALLS:  Has patient fallen in last 6 months? No  LIVING ENVIRONMENT: 4 steps into the house; Steps to get tot he bedroom 12 OCCUPATION:  Retired  Presenter, broadcasting: Art gallery manager tours at an [employer]   PLOF: Independent  PATIENT GOALS  To get back to regular life   OBJECTIVE:   DIAGNOSTIC FINDINGS:    PATIENT SURVEYS:  FOTO 59% this visit 61% expected   COGNITION:  Overall cognitive status: Within functional limits for tasks assessed     SENSATION: Sensation coming back in the  foot.   EDEMA:  Circumferential: 56.7 46.3   POSTURE: No Significant postural limitations  PALPATION: No unexpected tenderness to palpation   LOWER EXTREMITY ROM:  Active ROM Right eval Left eval Right  9/14  Hip flexion     Hip extension     Hip abduction     Hip adduction     Hip internal rotation     Hip external rotation     Knee flexion 103  125  Knee extension -6  0  Ankle dorsiflexion     Ankle plantarflexion     Ankle inversion     Ankle eversion      (Blank rows = not tested)  Passive ROM Right eval Left eval Right   Hip flexion     Hip extension     Hip abduction     Hip adduction     Hip internal rotation     Hip external rotation     Knee flexion 110  120  Knee extension -5  -3  Ankle dorsiflexion     Ankle plantarflexion     Ankle inversion     Ankle eversion      (Blank rows = not tested)  LOWER EXTREMITY MMT:  MMT Right eval Left eval  Hip flexion 4+ 5  Hip extension    Hip abduction 4 5  Hip adduction    Hip internal rotation    Hip external rotation    Knee flexion    Knee extension 3+ 5  Ankle dorsiflexion  1 5  Ankle plantarflexion    Ankle inversion    Ankle eversion     (Blank rows = not tested)  FUNCTIONAL TESTS:  Mild use of hands for sit to stand   GAIT: Significant foot drop with ambulation    TODAY'S TREATMENT: 09/28  Nustep L5x6 minutes BLEs only Forward and lateral step ups 6 inch box x15 R LE  Step down 2x10 4 inch   SLR 2x10 1.5 lbs  Bridge 2x15 SAQ 2x15 5 lbs  LAQ 3x10 5 lbs   Standing heel raise to church pew x20  Leg press x30 25 lbs   09/26 Nustep L5x6 minutes BLEs only Forward and lateral step ups 6 inch box x15 R LE  Step down 2x10 4 inch   SLR 2x10 1.5 lbs  Bridge 2x15 SAQ 2x15 5 lbs  LAQ 3x10 5 lbs   Standing heel raise to church pew x20    9/21  Nustep L5x6 minutes BLEs only Forward and lateral step ups 6 inch box x15 R LE  Step down 2x10 4 inch   SLR 2x10 1.5 lbs   Bridge 2x15 SAQ 2x15 5 lbs  LAQ 3x10 5 lbs   Standing heel raise to church pew x20    PATIENT EDUCATION:  Education details: HEP, symptom management; progression of activity  Person educated: Patient and Spouse Education method: Explanation, Demonstration, Tactile cues, Verbal cues, and Handouts Education comprehension: verbalized understanding, returned demonstration, verbal cues required, tactile cues required, and needs further education   HOME EXERCISE PROGRAM:  [id number] + initial HEP given at eval (code not in chart)  ASSESSMENT:  CLINICAL IMPRESSION: The patient has been working on the area of restriction in her anterior knee. It looks much better. She had no pain. We added the leg press back into her program. Overall her knee is doing very well. She continues to have no significant improvement in her DF. She will work on her exercises for a few weeks and then we will follow up and see if there is any change in her ankle.    OBJECTIVE IMPAIRMENTS Abnormal gait, decreased activity tolerance, decreased endurance, decreased mobility, difficulty walking, decreased ROM, decreased strength, and pain.   ACTIVITY LIMITATIONS bending, standing, squatting, sleeping, stairs, transfers, bed mobility, dressing, and locomotion level  PARTICIPATION LIMITATIONS: meal prep, cleaning, laundry, driving, occupation, yard work, and church  PERSONAL FACTORS Time since onset of injury/illness/exacerbation are also affecting patient's functional outcome.   REHAB POTENTIAL: Excellent  CLINICAL DECISION MAKING: Evolving/moderate complexity moderate complexity 2nd to foot drop   EVALUATION COMPLEXITY: Low   GOALS: Goals reviewed with patient? Yes  SHORT TERM GOALS: Target date: 08/23/2022 8/31 Patient will increase right knee flexion to 120 degrees  Baseline: Goal status: progressing   2.  Patient will demonstrate 3/5 right ankle DF strength  Baseline:  Goal status: still only trace  firing   3.  Patient will demonstrate full knee extension  Baseline:  Goal status: Improving    LONG TERM GOALS: Target date: 09/13/2022   Patient will walk community distances without pain in order to return to her job  Baseline:  Goal status: INITIAL  2.  Patient will go up/down 6 steps without pain  Baseline:  Goal status: INITIAL  3.  Patient will be independent with complete HEP  Baseline:    PLAN: PT FREQUENCY: 2x/week  PT DURATION: 8 weeks  PLANNED INTERVENTIONS: Therapeutic exercises, Therapeutic activity, Neuromuscular re-education, Balance training, Gait training, Patient/Family education, Self Care, Joint  mobilization, Stair training, DME instructions, Aquatic Therapy, Dry Needling, Electrical stimulation, Cryotherapy, Moist heat, Traction, Ultrasound, and Manual therapy  PLAN FOR NEXT SESSION: Russian to anterior tib; knee ROM; SLR; TKE; heel raise; standing march; begin stair training when able.    Carolyne Littles PT DPT  09/14/2022, 11:09 AM

## 2022-09-27 ENCOUNTER — Encounter (HOSPITAL_BASED_OUTPATIENT_CLINIC_OR_DEPARTMENT_OTHER): Payer: Self-pay | Admitting: Physical Therapy

## 2022-09-27 ENCOUNTER — Ambulatory Visit (HOSPITAL_BASED_OUTPATIENT_CLINIC_OR_DEPARTMENT_OTHER): Payer: BC Managed Care – PPO | Attending: Physician Assistant | Admitting: Physical Therapy

## 2022-09-27 DIAGNOSIS — M6281 Muscle weakness (generalized): Secondary | ICD-10-CM | POA: Diagnosis present

## 2022-09-27 DIAGNOSIS — R600 Localized edema: Secondary | ICD-10-CM | POA: Insufficient documentation

## 2022-09-27 DIAGNOSIS — R2689 Other abnormalities of gait and mobility: Secondary | ICD-10-CM | POA: Diagnosis present

## 2022-09-27 DIAGNOSIS — M25561 Pain in right knee: Secondary | ICD-10-CM | POA: Diagnosis present

## 2022-09-27 DIAGNOSIS — M25661 Stiffness of right knee, not elsewhere classified: Secondary | ICD-10-CM | POA: Insufficient documentation

## 2022-09-27 NOTE — Therapy (Signed)
OUTPATIENT PHYSICAL THERAPY LOWER EXTREMITY TREATMENT   Patient Name: Virginia Fischer MRN: [id number] DOB:01/15/1956, 66 y.o., female Today's Date: 09/28/2022   PT End of Session - 09/27/22 1437     Visit Number 15    Number of Visits 27    Date for PT Re-Evaluation 11/09/22    PT Start Time 1430    PT Stop Time 1512    PT Time Calculation (min) 42 min    Activity Tolerance Patient tolerated treatment well    Behavior During Therapy [facility] for tasks assessed/performed              Progress Note Reporting Period 08/31/2022 to 10/12  See note below for Objective Data and Assessment of Progress/Goals.         Past Medical History:  Diagnosis Date   Arthritis    Past Surgical History:  Procedure Laterality Date   CESAREAN SECTION  1995   TOTAL KNEE ARTHROPLASTY Right 07/14/2022   Procedure: RIGHT TOTAL KNEE ARTHROPLASTY;  Surgeon: Mcarthur Rossetti, MD;  Location: [facility];  Service: Orthopedics;  Laterality: Right;   Patient Active Problem List   Diagnosis Date Noted   Status post total right knee replacement 07/14/2022   Unilateral primary osteoarthritis, right knee 07/13/2022    PCP: Dr Marda Stalker   REFERRING PROVIDER: Dr Jean Rosenthal  REFERRING DIAG: R TKA   THERAPY DIAG:  Acute pain of right knee  Other abnormalities of gait and mobility  Stiffness of right knee, not elsewhere classified  Muscle weakness (generalized)  Localized edema  Rationale for Evaluation and Treatment Rehabilitation  ONSET DATE: 7/28  SUBJECTIVE:   SUBJECTIVE STATEMENT: The patient comes in after working on her exercises for 3 weeks. Overall she continues to report pain when she is walking. She is having significant lateral pain. She has noticed maybe a trace amount of movement in her DF. She is also having some pain with steps.   PERTINENT HISTORY: Right Knee OA  PAIN:  Are you having pain? Yes: NPRS scale:2/10  Pain location: back is better,  most pain is in the leg  Pain description: aching, feels tight  Aggravating factors: standing, walking Relieving factors: tylenol   PRECAUTIONS: None  WEIGHT BEARING RESTRICTIONS No  FALLS:  Has patient fallen in last 6 months? 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   EDEMA:  Circumferential: 56.7 46.3   POSTURE: No Significant postural limitations  PALPATION: No unexpected tenderness to palpation   LOWER EXTREMITY ROM:  Active ROM Right eval Left eval Right  9/14 Right  10/12    Hip flexion       Hip extension       Hip abduction       Hip adduction       Hip internal rotation       Hip external rotation       Knee flexion 103  125 117   Knee extension -6  0 -3   Ankle dorsiflexion       Ankle plantarflexion  Ankle inversion       Ankle eversion        (Blank rows = not tested)  Passive ROM Right eval Left eval Right   Hip flexion     Hip extension     Hip abduction     Hip adduction     Hip internal rotation     Hip external rotation     Knee flexion 110  120  Knee extension -5  -3  Ankle dorsiflexion     Ankle plantarflexion     Ankle inversion     Ankle eversion      (Blank rows = not tested)  LOWER EXTREMITY MMT:  MMT Right eval Left eval Right 10/12 Left 10/12  Hip flexion 4+ 5 36.8 39.5  Hip extension      Hip abduction 4 5 62.5 53.1  Hip adduction      Hip internal rotation      Hip external rotation      Knee flexion      Knee extension 3+ 5 43.5 47.7+ unable to break   Ankle dorsiflexion 1 5    Ankle plantarflexion       Ankle inversion      Ankle eversion       (Blank rows = not tested)  FUNCTIONAL TESTS:  Mild use of hands for sit to stand   GAIT: Significant foot drop with ambulation    TODAY'S TREATMENT:  10/12 Assisted DF with graded assistance x15 minimal DF detected but there was some  SLR 3x10  LAQ x30 2lbs   Reviewed strength measurements   Manual: trigger point release to her IT band and lateral hamstrings   Step up with slow controlled movement x15   Leg press x50 lbs x35     09/28  Nustep L5x6 minutes BLEs only Forward and lateral step ups 6 inch box x15 R LE  Step down 2x10 4 inch   SLR 2x10 1.5 lbs  Bridge 2x15 SAQ 2x15 5 lbs  LAQ 3x10 5 lbs   Standing heel raise to church pew x20  Leg press x30 25 lbs   PATIENT EDUCATION:  Education details: HEP, symptom management; progression of activity  Person educated: Patient and Spouse Education method: Explanation, Demonstration, Tactile cues, Verbal cues, and Handouts Education comprehension: verbalized understanding, returned demonstration, verbal cues required, tactile cues required, and needs further education   HOME EXERCISE PROGRAM:  [id number] + initial HEP given at eval (code not in chart)  ASSESSMENT:  CLINICAL IMPRESSION: The patient continues to have lateral pain when she walks. Looking at her strength, she continues to have a quad deficits on the left. The rest of her strengthen measurements are excellent. The patient had trace DF activation today which is between when she was seen a few weeks ago. Sh was advised at home to get a cuff weight to start to challenge her quad strengthening. At this time she has backtracked slightly with function andpain comapred to when we last saq her. We will resume therapy 1-2x a weke for 4 weeks.   OBJECTIVE IMPAIRMENTS Abnormal gait, decreased activity tolerance, decreased endurance, decreased mobility, difficulty walking, decreased ROM, decreased strength, and pain.    ACTIVITY LIMITATIONS bending, standing, squatting, sleeping, stairs, transfers, bed mobility, dressing, and locomotion level  PARTICIPATION LIMITATIONS: meal prep, cleaning, laundry, driving, occupation, yard work, and church  PERSONAL FACTORS Time since onset of injury/illness/exacerbation are also affecting patient's functional outcome.   REHAB POTENTIAL: Excellent  CLINICAL DECISION  MAKING: Evolving/moderate complexity moderate complexity 2nd to foot drop   EVALUATION COMPLEXITY: Low   GOALS: Goals reviewed with patient? Yes  SHORT TERM GOALS: Target date: 08/23/2022 8/31 Patient will increase right knee flexion to 120 degrees  Baseline: Goal status: progressing   2.  Patient will demonstrate 3/5 right ankle DF strength  Baseline:  Goal status: still only trace firing   3.  Patient will demonstrate full knee extension  Baseline:  Goal status: Improving    LONG TERM GOALS: Target date: 09/13/2022   Patient will walk community distances without pain in order to return to her job  Baseline:  Goal status: INITIAL  2.  Patient will go up/down 6 steps without pain  Baseline:  Goal status: INITIAL  3.  Patient will be independent with complete HEP  Baseline:    PLAN: PT FREQUENCY: 2x/week  PT DURATION: 8 weeks  PLANNED INTERVENTIONS: Therapeutic exercises, Therapeutic activity, Neuromuscular re-education, Balance training, Gait training, Patient/Family education, Self Care, Joint mobilization, Stair training, DME instructions, Aquatic Therapy, Dry Needling, Electrical stimulation, Cryotherapy, Moist heat, Traction, Ultrasound, and Manual therapy  PLAN FOR NEXT SESSION: Russian to anterior tib; knee ROM; SLR; TKE; heel raise; standing march; begin stair training when able.    Lorayne Bender PT DPT  09/28/2022, 10:35 AM

## 2022-09-28 ENCOUNTER — Encounter (HOSPITAL_BASED_OUTPATIENT_CLINIC_OR_DEPARTMENT_OTHER): Payer: Self-pay | Admitting: Physical Therapy

## 2022-09-28 ENCOUNTER — Encounter: Payer: Self-pay | Admitting: Orthopaedic Surgery

## 2022-09-28 ENCOUNTER — Ambulatory Visit (INDEPENDENT_AMBULATORY_CARE_PROVIDER_SITE_OTHER): Payer: BC Managed Care – PPO | Admitting: Orthopaedic Surgery

## 2022-09-28 DIAGNOSIS — Z96651 Presence of right artificial knee joint: Secondary | ICD-10-CM

## 2022-09-28 MED ORDER — PREGABALIN 75 MG PO CAPS: 75.0000 mg | ORAL_CAPSULE | Freq: Two times a day (BID) | ORAL | 0 refills | Status: DC

## 2022-09-28 NOTE — Progress Notes (Signed)
The patient is a 66 year old female who is recovering from a right knee replacement to be performed at the end of July.  She has been very diligent with getting her knee flexing and extending and has been very pleased with her physical therapist at [facility].  She is on Lyrica once a day and I have let her know that she can take this twice a day.  We are doing this due to the nerve pain she is having and she had a postoperative complication of foot drop as well as a DVT.  She is still on blood thinning medication.  She is off of narcotics.  She actually drove yesterday.  She has been compliant with her ASO.  She has some slight motor function coming back in her right foot.  It is slight but she is starting to fire the nerves fortunately.  She understands this will take a while for this to recover.  Her calf is soft.  Her range of motion of that right knee is excellent.  I did give her reassurance that the pain she is having is normal for someone so young and this will improve with time.  I did refill her Lyrica.  I would like to see her back in 4 weeks to see how she is doing overall and I would like a standing AP and lateral of her right knee at that visit.  She should continue outpatient physical therapy for at least another month.  She can try any scar creams on her knee and lotions.  All questions and concerns were answered and addressed.

## 2022-10-04 ENCOUNTER — Ambulatory Visit (HOSPITAL_BASED_OUTPATIENT_CLINIC_OR_DEPARTMENT_OTHER): Payer: BC Managed Care – PPO | Admitting: Physical Therapy

## 2022-10-04 DIAGNOSIS — M25561 Pain in right knee: Secondary | ICD-10-CM | POA: Diagnosis not present

## 2022-10-04 DIAGNOSIS — M25661 Stiffness of right knee, not elsewhere classified: Secondary | ICD-10-CM

## 2022-10-04 DIAGNOSIS — R2689 Other abnormalities of gait and mobility: Secondary | ICD-10-CM

## 2022-10-04 DIAGNOSIS — R600 Localized edema: Secondary | ICD-10-CM

## 2022-10-04 DIAGNOSIS — M6281 Muscle weakness (generalized): Secondary | ICD-10-CM

## 2022-10-05 ENCOUNTER — Encounter (HOSPITAL_BASED_OUTPATIENT_CLINIC_OR_DEPARTMENT_OTHER): Payer: Self-pay | Admitting: Physical Therapy

## 2022-10-05 NOTE — Therapy (Signed)
OUTPATIENT PHYSICAL THERAPY LOWER EXTREMITY TREATMENT   Patient Name: Virginia Fischer MRN: [id number] DOB:November 02, 1956, 66 y.o., female Today's Date: 10/04/2022   PT End of Session - 10/05/22 1241     Visit Number 16    Number of Visits 27    Date for PT Re-Evaluation 11/09/22    Authorization Type progress note done on 15 visits    PT Start Time 0845    PT Stop Time 0928    PT Time Calculation (min) 43 min    Activity Tolerance Patient tolerated treatment well    Behavior During Therapy [facility] for tasks assessed/performed              Progress Note Reporting Period 08/31/2022 to 10/12  See note below for Objective Data and Assessment of Progress/Goals.         Past Medical History:  Diagnosis Date   Arthritis    Past Surgical History:  Procedure Laterality Date   CESAREAN SECTION  1995   TOTAL KNEE ARTHROPLASTY Right 07/14/2022   Procedure: RIGHT TOTAL KNEE ARTHROPLASTY;  Surgeon: Mcarthur Rossetti, MD;  Location: [facility];  Service: Orthopedics;  Laterality: Right;   Patient Active Problem List   Diagnosis Date Noted   Status post total right knee replacement 07/14/2022   Unilateral primary osteoarthritis, right knee 07/13/2022    PCP: Dr Marda Stalker   REFERRING PROVIDER: Dr Jean Rosenthal  REFERRING DIAG: R TKA   THERAPY DIAG:  Acute pain of right knee  Other abnormalities of gait and mobility  Stiffness of right knee, not elsewhere classified  Muscle weakness (generalized)  Localized edema  Rationale for Evaluation and Treatment Rehabilitation  ONSET DATE: 7/28  SUBJECTIVE:   SUBJECTIVE STATEMENT: The patient continues to have lateral pain when she walks too much. She is noticing some firing of her dorsi flexors but not much.  PERTINENT HISTORY: Right Knee OA  PAIN:  Are you having pain? Yes: NPRS scale:2/10 more when she is walking 10/19  Pain location: back is better, most pain is in the leg  Pain description: aching,  feels tight  Aggravating factors: standing, walking Relieving factors: tylenol   PRECAUTIONS: None  WEIGHT BEARING RESTRICTIONS No  FALLS:  Has patient fallen in last 6 months? 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   EDEMA:  Circumferential: 56.7 46.3   POSTURE: No Significant postural limitations  PALPATION: No unexpected tenderness to palpation   LOWER EXTREMITY ROM:  Active ROM Right eval Left eval Right  9/14 Right  10/12  Right    Hip flexion        Hip extension        Hip abduction        Hip adduction        Hip internal rotation        Hip external rotation        Knee flexion 103  125 117 115   Knee extension -6  0 -3 0    Ankle dorsiflexion  Ankle plantarflexion        Ankle inversion        Ankle eversion         (Blank rows = not tested)  Passive ROM Right eval Left eval Right   Hip flexion     Hip extension     Hip abduction     Hip adduction     Hip internal rotation     Hip external rotation     Knee flexion 110  120  Knee extension -5  -3  Ankle dorsiflexion     Ankle plantarflexion     Ankle inversion     Ankle eversion      (Blank rows = not tested)  LOWER EXTREMITY MMT:  MMT Right eval Left eval Right 10/12 Left 10/12  Hip flexion 4+ 5 36.8 39.5  Hip extension      Hip abduction 4 5 62.5 53.1  Hip adduction      Hip internal rotation      Hip external rotation      Knee flexion      Knee extension 3+ 5 43.5 47.7+ unable to break   Ankle dorsiflexion 1 5    Ankle plantarflexion      Ankle inversion       Ankle eversion       (Blank rows = not tested)  FUNCTIONAL TESTS:  Mild use of hands for sit to stand   GAIT: Significant foot drop with ambulation    TODAY'S TREATMENT:  10/19 Assisted DF with graded assistance x15 minimal DF detected but there was some  Assisted DF with graded assistance x15 minimal DF detected but there was some  SLR 3x10  LAQ x30 2lbs   Reviewed strength measurements   Manual: trigger point release to her IT band and lateral hamstrings; reviewed self trigger point release  Step up with slow controlled movement x15   Step ups 6 inch 2x10  Lateral step up 6 inch 2x10  Step down 4 iinch 2x10   10/12 Assisted DF with graded assistance x15 minimal DF detected but there was some  SLR 3x10  LAQ x30 2lbs   Reviewed strength measurements   Manual: trigger point release to her IT band and lateral hamstrings   Step up with slow controlled movement x15   Leg press x50 lbs x35      PATIENT EDUCATION:  Education details: HEP, symptom management; progression of activity  Person educated: Patient and Spouse Education method: Explanation, Demonstration, Tactile cues, Verbal cues, and Handouts Education comprehension: verbalized understanding, returned demonstration, verbal cues required, tactile cues required, and needs further education   HOME EXERCISE PROGRAM:  [id number] + initial HEP given at eval (code not in chart)  ASSESSMENT:  CLINICAL IMPRESSION: The patient continues to have significant trigger points in her IT band. She was shown how to perform self trigger point release to those spots. We also reviewed self IT band trigger point release. She tolerated there-ex well. She did better with 4 inch step down today then last visit. She continues to have a trace of DF which is encouraging. We will continue to progress.  OBJECTIVE IMPAIRMENTS Abnormal gait, decreased activity tolerance, decreased endurance, decreased mobility, difficulty walking,  decreased ROM, decreased strength, and pain.   ACTIVITY LIMITATIONS bending, standing, squatting, sleeping, stairs, transfers, bed mobility, dressing, and locomotion level  PARTICIPATION LIMITATIONS: meal prep, cleaning, laundry, driving, occupation, yard work, and church  PERSONAL FACTORS Time since onset of injury/illness/exacerbation are also  affecting patient's functional outcome.   REHAB POTENTIAL: Excellent  CLINICAL DECISION MAKING: Evolving/moderate complexity moderate complexity 2nd to foot drop   EVALUATION COMPLEXITY: Low   GOALS: Goals reviewed with patient? Yes  SHORT TERM GOALS: Target date: 08/23/2022 8/31 Patient will increase right knee flexion to 120 degrees  Baseline: Goal status: progressing   2.  Patient will demonstrate 3/5 right ankle DF strength  Baseline:  Goal status: still only trace firing   3.  Patient will demonstrate full knee extension  Baseline:  Goal status: Improving    LONG TERM GOALS: Target date: 09/13/2022   Patient will walk community distances without pain in order to return to her job  Baseline:  Goal status: INITIAL  2.  Patient will go up/down 6 steps without pain  Baseline:  Goal status: INITIAL  3.  Patient will be independent with complete HEP  Baseline:    PLAN: PT FREQUENCY: 2x/week  PT DURATION: 8 weeks  PLANNED INTERVENTIONS: Therapeutic exercises, Therapeutic activity, Neuromuscular re-education, Balance training, Gait training, Patient/Family education, Self Care, Joint mobilization, Stair training, DME instructions, Aquatic Therapy, Dry Needling, Electrical stimulation, Cryotherapy, Moist heat, Traction, Ultrasound, and Manual therapy  PLAN FOR NEXT SESSION: Russian to anterior tib; knee ROM; SLR; TKE; heel raise; standing march; begin stair training when able.    Lorayne Bender PT DPT  10/05/2022, 12:43 PM

## 2022-10-10 ENCOUNTER — Encounter (HOSPITAL_BASED_OUTPATIENT_CLINIC_OR_DEPARTMENT_OTHER): Payer: Self-pay | Admitting: Physical Therapy

## 2022-10-10 ENCOUNTER — Ambulatory Visit (HOSPITAL_BASED_OUTPATIENT_CLINIC_OR_DEPARTMENT_OTHER): Payer: BC Managed Care – PPO | Admitting: Physical Therapy

## 2022-10-10 DIAGNOSIS — M25561 Pain in right knee: Secondary | ICD-10-CM | POA: Diagnosis not present

## 2022-10-10 DIAGNOSIS — M25661 Stiffness of right knee, not elsewhere classified: Secondary | ICD-10-CM

## 2022-10-10 DIAGNOSIS — M6281 Muscle weakness (generalized): Secondary | ICD-10-CM

## 2022-10-10 DIAGNOSIS — R2689 Other abnormalities of gait and mobility: Secondary | ICD-10-CM

## 2022-10-10 DIAGNOSIS — R600 Localized edema: Secondary | ICD-10-CM

## 2022-10-10 NOTE — Therapy (Signed)
OUTPATIENT PHYSICAL THERAPY LOWER EXTREMITY TREATMENT   Patient Name: Virginia Fischer MRN: [id number] DOB:08/13/1956, 66 y.o., female Today's Date: 10/04/2022   PT End of Session - 10/10/22 1023     Visit Number 17    Number of Visits 27    Date for PT Re-Evaluation 11/09/22    Authorization Type progress note done on 15 visits    PT Start Time 1019    PT Stop Time 1100    PT Time Calculation (min) 41 min    Activity Tolerance Patient tolerated treatment well    Behavior During Therapy [facility] for tasks assessed/performed              Progress Note Reporting Period 08/31/2022 to 10/12  See note below for Objective Data and Assessment of Progress/Goals.         Past Medical History:  Diagnosis Date   Arthritis    Past Surgical History:  Procedure Laterality Date   CESAREAN SECTION  1995   TOTAL KNEE ARTHROPLASTY Right 07/14/2022   Procedure: RIGHT TOTAL KNEE ARTHROPLASTY;  Surgeon: Mcarthur Rossetti, MD;  Location: [facility];  Service: Orthopedics;  Laterality: Right;   Patient Active Problem List   Diagnosis Date Noted   Status post total right knee replacement 07/14/2022   Unilateral primary osteoarthritis, right knee 07/13/2022    PCP: Dr Marda Stalker   REFERRING PROVIDER: Dr Jean Rosenthal  REFERRING DIAG: R TKA   THERAPY DIAG:  Acute pain of right knee  Other abnormalities of gait and mobility  Stiffness of right knee, not elsewhere classified  Muscle weakness (generalized)  Localized edema  Rationale for Evaluation and Treatment Rehabilitation  ONSET DATE: 7/28  SUBJECTIVE:   SUBJECTIVE STATEMENT: The patient continues to feel like she is having some improvement. She did really well Saturday and Sunday but had pain on Monday.  PERTINENT HISTORY: Right Knee OA  PAIN:  Are you having pain? Yes: NPRS scale:2/10 more when she is walking 10/19  Pain location: back is better, most pain is in the leg  Pain description: aching,  feels tight  Aggravating factors: standing, walking Relieving factors: tylenol   PRECAUTIONS: None  WEIGHT BEARING RESTRICTIONS No  FALLS:  Has patient fallen in last 6 months? 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   EDEMA:  Circumferential: 56.7 46.3   POSTURE: No Significant postural limitations  PALPATION: No unexpected tenderness to palpation   LOWER EXTREMITY ROM:  Active ROM Right eval Left eval Right  9/14 Right  10/12  Right    Hip flexion        Hip extension        Hip abduction        Hip adduction        Hip internal rotation        Hip external rotation        Knee flexion 103  125 117 115   Knee extension -6  0 -3 0    Ankle dorsiflexion        Ankle  plantarflexion        Ankle inversion        Ankle eversion         (Blank rows = not tested)  Passive ROM Right eval Left eval Right   Hip flexion     Hip extension     Hip abduction     Hip adduction     Hip internal rotation     Hip external rotation     Knee flexion 110  120  Knee extension -5  -3  Ankle dorsiflexion     Ankle plantarflexion     Ankle inversion     Ankle eversion      (Blank rows = not tested)  LOWER EXTREMITY MMT:  MMT Right eval Left eval Right 10/12 Left 10/12  Hip flexion 4+ 5 36.8 39.5  Hip extension      Hip abduction 4 5 62.5 53.1  Hip adduction      Hip internal rotation      Hip external rotation      Knee flexion      Knee extension 3+ 5 43.5 47.7+ unable to break   Ankle dorsiflexion 1 5    Ankle plantarflexion      Ankle inversion       Ankle eversion       (Blank rows = not tested)  FUNCTIONAL TESTS:  Mild use of hands for sit to stand   GAIT: Significant foot drop with ambulation    TODAY'S TREATMENT: 10/24 Assisted DF with graded assistance x15 minimal DF detected but there was some  Assisted DF with graded assistance x15 minimal DF detected but there was some  SLR 3x10  LAQ x30 2lbs   Reviewed strength measurements   Manual: trigger point release to her IT band and lateral hamstrings; reviewed self trigger point release  Step up with slow controlled movement x15   Step ups 6 inch 2x10  Lateral step up 6 inch 2x10  Step down 4 iinch 2x10    10/19 Assisted DF with graded assistance x15 minimal DF detected but there was some  Assisted DF with graded assistance x15 minimal DF detected but there was some  SLR 3x10  LAQ x30 2lbs   Reviewed strength measurements   Manual: trigger point release to her IT band and lateral hamstrings; reviewed self trigger point release  Step up with slow controlled movement x15   Step ups 6 inch 2x10  Lateral step up 6 inch 2x10  Step down 4 iinch 2x10   10/12 Assisted DF with graded assistance x15 minimal DF detected but there was some  SLR 3x10  LAQ x30 2lbs   Reviewed strength measurements   Manual: trigger point release to her IT band and lateral hamstrings   Step up with slow controlled movement x15   Leg press x50 lbs x35      PATIENT EDUCATION:  Education details: HEP, symptom management; progression of activity  Person educated: Patient and Spouse Education method: Explanation, Demonstration, Tactile cues, Verbal cues, and Handouts Education comprehension: verbalized understanding, returned demonstration, verbal cues required, tactile cues required, and needs further education   HOME EXERCISE PROGRAM:  [id number] + initial HEP given at eval (code not in chart)  ASSESSMENT:  CLINICAL IMPRESSION: The patients trigger points in her IT band  are better but they are still there. We reviewed the benefits of dry needling. We advanced her weights of her exercises some. Overall she is making good progress. Therapy will  continue to progress as tolerated. She is having more reaction of the anterior tib/peroneals   OBJECTIVE IMPAIRMENTS Abnormal gait, decreased activity tolerance, decreased endurance, decreased mobility, difficulty walking, decreased ROM, decreased strength, and pain.   ACTIVITY LIMITATIONS bending, standing, squatting, sleeping, stairs, transfers, bed mobility, dressing, and locomotion level  PARTICIPATION LIMITATIONS: meal prep, cleaning, laundry, driving, occupation, yard work, and church  PERSONAL FACTORS Time since onset of injury/illness/exacerbation are also affecting patient's functional outcome.   REHAB POTENTIAL: Excellent  CLINICAL DECISION MAKING: Evolving/moderate complexity moderate complexity 2nd to foot drop   EVALUATION COMPLEXITY: Low   GOALS: Goals reviewed with patient? Yes  SHORT TERM GOALS: Target date: 08/23/2022 8/31 Patient will increase right knee flexion to 120 degrees  Baseline: Goal status: progressing   2.  Patient will demonstrate 3/5 right ankle DF strength  Baseline:  Goal status: still only trace firing   3.  Patient will demonstrate full knee extension  Baseline:  Goal status: Improving    LONG TERM GOALS: Target date: 09/13/2022   Patient will walk community distances without pain in order to return to her job  Baseline:  Goal status: INITIAL  2.  Patient will go up/down 6 steps without pain  Baseline:  Goal status: INITIAL  3.  Patient will be independent with complete HEP  Baseline:    PLAN: PT FREQUENCY: 2x/week  PT DURATION: 8 weeks  PLANNED INTERVENTIONS: Therapeutic exercises, Therapeutic activity, Neuromuscular re-education, Balance training, Gait training, Patient/Family education, Self Care, Joint mobilization, Stair training, DME instructions,  Aquatic Therapy, Dry Needling, Electrical stimulation, Cryotherapy, Moist heat, Traction, Ultrasound, and Manual therapy  PLAN FOR NEXT SESSION: Russian to anterior tib; knee ROM; SLR; TKE; heel raise; standing march; begin stair training when able.    Lorayne Bender PT DPT  10/10/2022, 10:42 AM

## 2022-10-12 ENCOUNTER — Encounter (HOSPITAL_BASED_OUTPATIENT_CLINIC_OR_DEPARTMENT_OTHER): Payer: BC Managed Care – PPO | Admitting: Physical Therapy

## 2022-10-13 ENCOUNTER — Encounter (HOSPITAL_BASED_OUTPATIENT_CLINIC_OR_DEPARTMENT_OTHER): Payer: Self-pay | Admitting: Physical Therapy

## 2022-10-13 ENCOUNTER — Ambulatory Visit (HOSPITAL_BASED_OUTPATIENT_CLINIC_OR_DEPARTMENT_OTHER): Payer: BC Managed Care – PPO | Admitting: Physical Therapy

## 2022-10-13 DIAGNOSIS — R600 Localized edema: Secondary | ICD-10-CM

## 2022-10-13 DIAGNOSIS — M25661 Stiffness of right knee, not elsewhere classified: Secondary | ICD-10-CM

## 2022-10-13 DIAGNOSIS — M6281 Muscle weakness (generalized): Secondary | ICD-10-CM

## 2022-10-13 DIAGNOSIS — R2689 Other abnormalities of gait and mobility: Secondary | ICD-10-CM

## 2022-10-13 DIAGNOSIS — M25561 Pain in right knee: Secondary | ICD-10-CM | POA: Diagnosis not present

## 2022-10-13 NOTE — Therapy (Signed)
OUTPATIENT PHYSICAL THERAPY LOWER EXTREMITY TREATMENT   Patient Name: Virginia Fischer MRN: [id number] DOB:[birth year], 66 y.o., female Today's Date: 10/04/2022   PT End of Session - 10/13/22 0804     Visit Number 18    Number of Visits 27    Date for PT Re-Evaluation 11/09/22    PT Start Time 0802    PT Stop Time 0844    PT Time Calculation (min) 42 min    Activity Tolerance Patient tolerated treatment well    Behavior During Therapy [facility] for tasks assessed/performed              Progress Note Reporting Period 08/31/2022 to 10/12  See note below for Objective Data and Assessment of Progress/Goals.         Past Medical History:  Diagnosis Date   Arthritis    Past Surgical History:  Procedure Laterality Date   CESAREAN SECTION  1995   TOTAL KNEE ARTHROPLASTY Right 07/14/2022   Procedure: RIGHT TOTAL KNEE ARTHROPLASTY;  Surgeon: Kathryne Hitch, MD;  Location: [facility];  Service: Orthopedics;  Laterality: Right;   Patient Active Problem List   Diagnosis Date Noted   Status post total right knee replacement 07/14/2022   Unilateral primary osteoarthritis, right knee 07/13/2022    PCP: Dr Jarrett Soho   REFERRING PROVIDER: Dr Doneen Poisson  REFERRING DIAG: R TKA   THERAPY DIAG:  Acute pain of right knee  Other abnormalities of gait and mobility  Stiffness of right knee, not elsewhere classified  Muscle weakness (generalized)  Localized edema  Rationale for Evaluation and Treatment Rehabilitation  ONSET DATE: 7/28  SUBJECTIVE:   SUBJECTIVE STATEMENT: The patient continues to feel like she is having some improvement. She did really well Saturday and Sunday but had pain on Monday.  PERTINENT HISTORY: Right Knee OA  PAIN:  Are you having pain? Yes: NPRS scale:2/10 more when she is walking 10/19  Pain location: back is better, most pain is in the leg  Pain description: aching, feels tight  Aggravating factors: standing,  walking Relieving factors: tylenol   PRECAUTIONS: None  WEIGHT BEARING RESTRICTIONS No  FALLS:  Has patient fallen in last 6 months? 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   EDEMA:  Circumferential: 56.7 46.3   POSTURE: No Significant postural limitations  PALPATION: No unexpected tenderness to palpation   LOWER EXTREMITY ROM:  Active ROM Right eval Left eval Right  9/14 Right  10/12  Right    Hip flexion        Hip extension        Hip abduction        Hip adduction        Hip internal rotation        Hip external rotation        Knee flexion 103  125 117 115   Knee extension -6  0 -3 0    Ankle dorsiflexion        Ankle plantarflexion        Ankle inversion  Ankle eversion         (Blank rows = not tested)  Passive ROM Right eval Left eval Right   Hip flexion     Hip extension     Hip abduction     Hip adduction     Hip internal rotation     Hip external rotation     Knee flexion 110  120  Knee extension -5  -3  Ankle dorsiflexion     Ankle plantarflexion     Ankle inversion     Ankle eversion      (Blank rows = not tested)  LOWER EXTREMITY MMT:  MMT Right eval Left eval Right 10/12 Left 10/12  Hip flexion 4+ 5 36.8 39.5  Hip extension      Hip abduction 4 5 62.5 53.1  Hip adduction      Hip internal rotation      Hip external rotation      Knee flexion      Knee extension 3+ 5 43.5 47.7+ unable to break   Ankle dorsiflexion 1 5    Ankle plantarflexion      Ankle inversion      Ankle eversion       (Blank rows = not  tested)  FUNCTIONAL TESTS:  Mild use of hands for sit to stand   GAIT: Significant foot drop with ambulation    TODAY'S TREATMENT: 10/27 Assisted DF with graded assistance x15 minimal DF detected but there was some  Assisted DF with graded assistance x15 minimal DF detected but there was some  SLR 3x10  LAQ x30 2lbs   Reviewed strength measurements   Manual: trigger point release to her IT band and lateral hamstrings; reviewed self trigger point release  Step up with slow controlled movement x15   Step ups 6 inch 2x10  Lateral step up 6 inch 2x10  Step down 4 iinch 2x10   Trigger Point Dry-Needling  Treatment instructions: Expect mild to moderate muscle soreness. S/S of pneumothorax if dry needled over a lung field, and to seek immediate medical attention should they occur. Patient verbalized understanding of these instructions and education.  Patient Consent Given: Yes Education handout provided: Previously provided Muscles treated: lateral mid quad 4 spots with a 30x50 needle  Electrical stimulation performed: No Parameters: N/A Treatment response/outcome: good twitch with 2-4 needles    Balance arrow base eyes closed 2x30 sec  Tandem stance 2x20 sec hold    10/24 Assisted DF with graded assistance x15 minimal DF detected but there was some  Assisted DF with graded assistance x15 minimal DF detected but there was some  SLR 3x10  LAQ x30 2lbs   Reviewed strength measurements   Manual: trigger point release to her IT band and lateral hamstrings; reviewed self trigger point release  Step up with slow controlled movement x15   Step ups 6 inch 2x10  Lateral step up 6 inch 2x10  Step down 4 iinch 2x10    10/19 Assisted DF with graded assistance x15 minimal DF detected but there was some  Assisted DF with graded assistance x15 minimal DF detected but there was some  SLR 3x10  LAQ x30 2lbs   Reviewed strength measurements   Manual: trigger point release to  her IT band and lateral hamstrings; reviewed self trigger point release  Step up with slow controlled movement x15   Step ups 6 inch 2x10  Lateral step up 6 inch 2x10  Step down 4 iinch 2x10  PATIENT EDUCATION:  Education details: HEP, symptom management; progression of activity  Person educated: Patient and Spouse Education method: Explanation, Demonstration, Tactile cues, Verbal cues, and Handouts Education comprehension: verbalized understanding, returned demonstration, verbal cues required, tactile cues required, and needs further education   HOME EXERCISE PROGRAM:  [id number] + initial HEP given at eval (code not in chart)  ASSESSMENT:  CLINICAL IMPRESSION: Therapy  prfroemd a trial of trigger point dry needling to the lateral quad. She had a good twitch with 2 of the needles. We reviewed how to work on post needle soreness. She feels like her balance has been off. We also reviewed a few balance exercises that she can do at home. Her DF continues to improve. Therapy will continue to progress as tolerated.   OBJECTIVE IMPAIRMENTS Abnormal gait, decreased activity tolerance, decreased endurance, decreased mobility, difficulty walking, decreased ROM, decreased strength, and pain.   ACTIVITY LIMITATIONS bending, standing, squatting, sleeping, stairs, transfers, bed mobility, dressing, and locomotion level  PARTICIPATION LIMITATIONS: meal prep, cleaning, laundry, driving, occupation, yard work, and church  PERSONAL FACTORS Time since onset of injury/illness/exacerbation are also affecting patient's functional outcome.   REHAB POTENTIAL: Excellent  CLINICAL DECISION MAKING: Evolving/moderate complexity moderate complexity 2nd to foot drop   EVALUATION COMPLEXITY: Low   GOALS: Goals reviewed with patient? Yes  SHORT TERM GOALS: Target date: 08/23/2022 8/31 Patient will increase right knee flexion to 120 degrees  Baseline: Goal status: progressing   2.  Patient will  demonstrate 3/5 right ankle DF strength  Baseline:  Goal status: still only trace firing   3.  Patient will demonstrate full knee extension  Baseline:  Goal status: Improving    LONG TERM GOALS: Target date: 09/13/2022   Patient will walk community distances without pain in order to return to her job  Baseline:  Goal status: INITIAL  2.  Patient will go up/down 6 steps without pain  Baseline:  Goal status: INITIAL  3.  Patient will be independent with complete HEP  Baseline:    PLAN: PT FREQUENCY: 2x/week  PT DURATION: 8 weeks  PLANNED INTERVENTIONS: Therapeutic exercises, Therapeutic activity, Neuromuscular re-education, Balance training, Gait training, Patient/Family education, Self Care, Joint mobilization, Stair training, DME instructions, Aquatic Therapy, Dry Needling, Electrical stimulation, Cryotherapy, Moist heat, Traction, Ultrasound, and Manual therapy  PLAN FOR NEXT SESSION: Russian to anterior tib; knee ROM; SLR; TKE; heel raise; standing march; begin stair training when able.    Lorayne Bender PT DPT  10/13/2022, 8:07 AM

## 2022-10-17 ENCOUNTER — Encounter (HOSPITAL_BASED_OUTPATIENT_CLINIC_OR_DEPARTMENT_OTHER): Payer: Self-pay | Admitting: Physical Therapy

## 2022-10-17 ENCOUNTER — Ambulatory Visit (HOSPITAL_BASED_OUTPATIENT_CLINIC_OR_DEPARTMENT_OTHER): Payer: BC Managed Care – PPO | Admitting: Physical Therapy

## 2022-10-17 DIAGNOSIS — M25661 Stiffness of right knee, not elsewhere classified: Secondary | ICD-10-CM

## 2022-10-17 DIAGNOSIS — M25561 Pain in right knee: Secondary | ICD-10-CM | POA: Diagnosis not present

## 2022-10-17 DIAGNOSIS — R2689 Other abnormalities of gait and mobility: Secondary | ICD-10-CM

## 2022-10-17 DIAGNOSIS — R600 Localized edema: Secondary | ICD-10-CM

## 2022-10-17 DIAGNOSIS — M6281 Muscle weakness (generalized): Secondary | ICD-10-CM

## 2022-10-17 NOTE — Therapy (Signed)
OUTPATIENT PHYSICAL THERAPY LOWER EXTREMITY TREATMENT   Patient Name: Virginia Fischer MRN: [id number] DOB:01/17/1956, 66 y.o., female Today's Date: 10/04/2022      Progress Note Reporting Period 08/31/2022 to 10/12  See note below for Objective Data and Assessment of Progress/Goals.         Past Medical History:  Diagnosis Date   Arthritis    Past Surgical History:  Procedure Laterality Date   CESAREAN SECTION  1995   TOTAL KNEE ARTHROPLASTY Right 07/14/2022   Procedure: RIGHT TOTAL KNEE ARTHROPLASTY;  Surgeon: Mcarthur Rossetti, MD;  Location: [facility];  Service: Orthopedics;  Laterality: Right;   Patient Active Problem List   Diagnosis Date Noted   Status post total right knee replacement 07/14/2022   Unilateral primary osteoarthritis, right knee 07/13/2022    PCP: Dr Marda Stalker   REFERRING PROVIDER: Dr Jean Rosenthal  REFERRING DIAG: R TKA   THERAPY DIAG:  No diagnosis found.  Rationale for Evaluation and Treatment Rehabilitation  ONSET DATE: 7/28  SUBJECTIVE:   SUBJECTIVE STATEMENT: The rpeorts significant improvement with the needling.  PERTINENT HISTORY: Right Knee OA  PAIN:  Are you having pain? Yes: NPRS scale:2/10 more when she is walking  but better since needling 10/31 Pain location: back is better, most pain is in the leg  Pain description: aching, feels tight  Aggravating factors: standing, walking Relieving factors: tylenol   PRECAUTIONS: None  WEIGHT BEARING RESTRICTIONS No  FALLS:  Has patient fallen in last 6 months? 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   EDEMA:  Circumferential: 56.7 46.3   POSTURE: No Significant postural limitations  PALPATION: No unexpected tenderness to palpation   LOWER EXTREMITY ROM:  Active ROM Right eval Left eval Right  9/14 Right  10/12  Right    Hip flexion        Hip extension        Hip abduction        Hip adduction        Hip internal rotation        Hip external rotation        Knee flexion 103  125 117 115   Knee extension -6  0 -3 0    Ankle dorsiflexion        Ankle plantarflexion        Ankle inversion        Ankle eversion         (Blank rows = not tested)  Passive ROM Right eval Left eval Right   Hip flexion     Hip extension     Hip abduction     Hip adduction     Hip internal rotation     Hip external rotation     Knee flexion 110  120  Knee extension -5  -3  Ankle dorsiflexion     Ankle plantarflexion     Ankle inversion     Ankle eversion      (Blank rows = not tested)  LOWER EXTREMITY MMT:  MMT  Right eval Left eval Right 10/12 Left 10/12  Hip flexion 4+ 5 36.8 39.5  Hip extension      Hip abduction 4 5 62.5 53.1  Hip adduction      Hip internal rotation      Hip external rotation      Knee flexion      Knee extension 3+ 5 43.5 47.7+ unable to break   Ankle dorsiflexion 1 5    Ankle plantarflexion      Ankle inversion      Ankle eversion       (Blank rows = not tested)  FUNCTIONAL TESTS:  Mild use of hands for sit to stand   GAIT: Significant foot drop with ambulation    TODAY'S TREATMENT: 10/31  Trigger Point Dry-Needling  Treatment instructions: Expect mild to moderate muscle soreness. S/S of pneumothorax if dry needled over a lung field, and to seek immediate medical attention should they occur. Patient verbalized understanding of these instructions and education.  Patient Consent Given: Yes Education handout provided: Previously provided Muscles treated: lateral lateral  distal hamstring  4 spots with a 30x50 needle  Electrical stimulation performed: No Parameters: N/A Treatment response/outcome: less twitch then with previous needles.   Air-ex: narrow base eyes closed 3x20 sec hold   Frward step x15 onto air-ex  Lateral step x20 onto air-ex  Squat 2x15   SLR x30       10/27 Assisted DF with graded assistance x15 minimal DF detected but there was some  Assisted DF with graded assistance x15 minimal DF detected but there was some  SLR 3x10  LAQ x30 2lbs   Reviewed strength measurements   Manual: trigger point release to her IT band and lateral hamstrings; reviewed self trigger point release  Step up with slow controlled movement x15   Step ups 6 inch 2x10  Lateral step up 6 inch 2x10  Step down 4 iinch 2x10   Trigger Point Dry-Needling  Treatment instructions: Expect mild to moderate muscle soreness. S/S of pneumothorax if dry needled over a lung field, and to seek immediate medical attention should they occur. Patient verbalized understanding of these instructions and education.  Patient Consent Given: Yes Education handout provided: Previously provided Muscles treated: lateral mid quad 4 spots with a 30x50 needle  Electrical stimulation performed: No Parameters: N/A Treatment response/outcome: good twitch with 2-4 needles    Balance arrow base eyes closed 2x30 sec  Tandem stance 2x20 sec hold    PATIENT EDUCATION:  Education details: HEP, symptom management; progression of activity  Person educated: Patient and Spouse Education method: Explanation, Demonstration, Tactile cues, Verbal cues, and Handouts Education comprehension: verbalized understanding, returned demonstration, verbal cues required, tactile cues required, and needs further education   HOME EXERCISE PROGRAM:  [id number] + initial HEP given at eval (code not in chart)  ASSESSMENT:  CLINICAL IMPRESSION: The patients trigger point in her IT band is much better.  She has a trigger point in her hamstrings as well but we did not get as much of a twitch out of it today> We continue to focus on instability work. We added in air-ex exercises today. She tolerated well. She felt improvement with trials. We will continue to progress as tolerated.    OBJECTIVE IMPAIRMENTS Abnormal gait, decreased activity tolerance, decreased endurance, decreased mobility, difficulty walking, decreased ROM, decreased strength, and pain.   ACTIVITY LIMITATIONS bending, standing, squatting, sleeping, stairs, transfers, bed mobility, dressing, and locomotion level  PARTICIPATION LIMITATIONS: meal prep, cleaning,  laundry, driving, occupation, yard work, and church  PERSONAL FACTORS Time since onset of injury/illness/exacerbation are also affecting patient's functional outcome.   REHAB POTENTIAL: Excellent  CLINICAL DECISION MAKING: Evolving/moderate complexity moderate complexity 2nd to foot drop   EVALUATION COMPLEXITY: Low   GOALS: Goals reviewed with patient? Yes  SHORT TERM GOALS: Target date: 08/23/2022 8/31 Patient will increase right knee flexion to 120 degrees  Baseline: Goal status: progressing   2.  Patient will demonstrate 3/5 right ankle DF strength  Baseline:  Goal status: still only trace firing   3.  Patient will demonstrate full knee extension  Baseline:  Goal status: Improving    LONG TERM GOALS: Target date: 09/13/2022   Patient will walk community distances without pain in order to return to her job  Baseline:  Goal status: INITIAL  2.  Patient will go up/down 6 steps without pain  Baseline:  Goal status: INITIAL  3.  Patient will be independent with complete HEP  Baseline:    PLAN: PT FREQUENCY: 2x/week  PT DURATION: 8 weeks  PLANNED INTERVENTIONS: Therapeutic exercises, Therapeutic activity, Neuromuscular re-education, Balance training, Gait training, Patient/Family education, Self Care, Joint mobilization, Stair training, DME  instructions, Aquatic Therapy, Dry Needling, Electrical stimulation, Cryotherapy, Moist heat, Traction, Ultrasound, and Manual therapy  PLAN FOR NEXT SESSION: Russian to anterior tib; knee ROM; SLR; TKE; heel raise; standing march; begin stair training when able.    Lorayne Bender PT DPT  10/17/2022, 10:27 AM

## 2022-10-19 ENCOUNTER — Ambulatory Visit (HOSPITAL_BASED_OUTPATIENT_CLINIC_OR_DEPARTMENT_OTHER): Payer: BC Managed Care – PPO | Admitting: Physical Therapy

## 2022-10-24 ENCOUNTER — Ambulatory Visit (HOSPITAL_BASED_OUTPATIENT_CLINIC_OR_DEPARTMENT_OTHER): Payer: BC Managed Care – PPO | Attending: Physician Assistant | Admitting: Physical Therapy

## 2022-10-24 DIAGNOSIS — R2689 Other abnormalities of gait and mobility: Secondary | ICD-10-CM | POA: Insufficient documentation

## 2022-10-24 DIAGNOSIS — M25661 Stiffness of right knee, not elsewhere classified: Secondary | ICD-10-CM | POA: Insufficient documentation

## 2022-10-24 DIAGNOSIS — R600 Localized edema: Secondary | ICD-10-CM | POA: Insufficient documentation

## 2022-10-24 DIAGNOSIS — M25561 Pain in right knee: Secondary | ICD-10-CM | POA: Diagnosis not present

## 2022-10-24 DIAGNOSIS — M6281 Muscle weakness (generalized): Secondary | ICD-10-CM | POA: Diagnosis present

## 2022-10-24 NOTE — Therapy (Signed)
OUTPATIENT PHYSICAL THERAPY LOWER EXTREMITY TREATMENT   Patient Name: Virginia Fischer MRN: [id number] DOB:03/08/1956, 66 y.o., female Today's Date: 10/04/2022      Progress Note Reporting Period 08/31/2022 to 10/12  See note below for Objective Data and Assessment of Progress/Goals.         Past Medical History:  Diagnosis Date   Arthritis    Past Surgical History:  Procedure Laterality Date   CESAREAN SECTION  1995   TOTAL KNEE ARTHROPLASTY Right 07/14/2022   Procedure: RIGHT TOTAL KNEE ARTHROPLASTY;  Surgeon: Kathryne Hitch, MD;  Location: [facility];  Service: Orthopedics;  Laterality: Right;   Patient Active Problem List   Diagnosis Date Noted   Status post total right knee replacement 07/14/2022   Unilateral primary osteoarthritis, right knee 07/13/2022    PCP: Dr Jarrett Soho   REFERRING PROVIDER: Dr Doneen Poisson  REFERRING DIAG: R TKA   THERAPY DIAG:  No diagnosis found.  Rationale for Evaluation and Treatment Rehabilitation  ONSET DATE: 7/28  SUBJECTIVE:   SUBJECTIVE STATEMENT: The patient has stopped using the AFO. She is having functional active DF.   PERTINENT HISTORY: Right Knee OA  PAIN:  Are you having pain? Yes: NPRS scale:2/10 more when she is walking  but better since needling 10/31 Pain location: back is better, most pain is in the leg  Pain description: aching, feels tight  Aggravating factors: standing, walking Relieving factors: tylenol   PRECAUTIONS: None  WEIGHT BEARING RESTRICTIONS No  FALLS:  Has patient fallen in last 6 months? No  LIVING ENVIRONMENT: 4 steps into the house; Steps to get tot he bedroom 12 OCCUPATION:  Retired  Presenter, broadcasting: Art gallery manager tours at an [employer]   PLOF: Independent  PATIENT GOALS  To get back to regular life   OBJECTIVE:   DIAGNOSTIC FINDINGS:    PATIENT SURVEYS:  FOTO 59% this visit 61% expected   COGNITION:  Overall cognitive status: Within functional limits  for tasks assessed     SENSATION: Sensation coming back in the foot.   EDEMA:  Circumferential: 56.7 46.3   POSTURE: No Significant postural limitations  PALPATION: No unexpected tenderness to palpation   LOWER EXTREMITY ROM:  Active ROM Right eval Left eval Right  9/14 Right  10/12  Right    Hip flexion        Hip extension        Hip abduction        Hip adduction        Hip internal rotation        Hip external rotation        Knee flexion 103  125 117 115   Knee extension -6  0 -3 0    Ankle dorsiflexion        Ankle plantarflexion        Ankle inversion        Ankle eversion         (Blank rows = not tested)  Passive ROM Right eval Left eval Right   Hip flexion     Hip extension     Hip abduction     Hip adduction     Hip internal rotation     Hip external rotation     Knee flexion 110  120  Knee extension -5  -3  Ankle dorsiflexion     Ankle plantarflexion     Ankle inversion     Ankle eversion      (Blank rows = not  tested)  LOWER EXTREMITY MMT:  MMT Right eval Left eval Right 10/12 Left 10/12  Hip flexion 4+ 5 36.8 39.5  Hip extension      Hip abduction 4 5 62.5 53.1  Hip adduction      Hip internal rotation      Hip external rotation      Knee flexion      Knee extension 3+ 5 43.5 47.7+ unable to break   Ankle dorsiflexion 1 5    Ankle plantarflexion      Ankle inversion      Ankle eversion       (Blank rows = not tested)  FUNCTIONAL TESTS:  Mild use of hands for sit to stand   GAIT: Significant foot drop with ambulation    TODAY'S TREATMENT: 11/7 Trigger Point Dry-Needling  Treatment instructions: Expect mild to moderate muscle soreness. S/S of pneumothorax if dry needled over a lung field, and to seek immediate medical attention should they occur. Patient verbalized understanding of these instructions and education.  Patient Consent Given: Yes Education handout provided: Previously provided Muscles treated: lateral  lateral distal hamstring  4 spots with a 30x50 needle  Electrical stimulation performed: No Parameters: N/A Treatment response/outcome: less twitch then with previous needles.  10/31  Trigger Point Dry-Needling  Treatment instructions: Expect mild to moderate muscle soreness. S/S of pneumothorax if dry needled over a lung field, and to seek immediate medical attention should they occur. Patient verbalized understanding of these instructions and education.  Patient Consent Given: Yes Education handout provided: Previously provided Muscles treated: lateral lateral distal hamstring  4 spots with a 30x50 needle  Electrical stimulation performed: No Parameters: N/A Treatment response/outcome: less twitch then with previous needles.   Air-ex: narrow base eyes closed 3x20 sec hold   Frward step x15 onto air-ex  Lateral step x20 onto air-ex  Squat 2x15   SLR x30       10/27 Assisted DF with graded assistance x15 minimal DF detected but there was some  Assisted DF with graded assistance x15 minimal DF detected but there was some  SLR 3x10  LAQ x30 2lbs   Reviewed strength measurements   Manual: trigger point release to her IT band and lateral hamstrings; reviewed self trigger point release  Step up with slow controlled movement x15   Step ups 6 inch 2x10  Lateral step up 6 inch 2x10  Step down 4 iinch 2x10   Trigger Point Dry-Needling  Treatment instructions: Expect mild to moderate muscle soreness. S/S of pneumothorax if dry needled over a lung field, and to seek immediate medical attention should they occur. Patient verbalized understanding of these instructions and education.  Patient Consent Given: Yes Education handout provided: Previously provided Muscles treated: lateral mid quad 4 spots with a 30x50 needle  Electrical stimulation performed: No Parameters: N/A Treatment response/outcome: good twitch with 2-4 needles    Balance arrow base eyes closed 2x30 sec   Tandem stance 2x20 sec hold    PATIENT EDUCATION:  Education details: HEP, symptom management; progression of activity  Person educated: Patient and Spouse Education method: Explanation, Demonstration, Tactile cues, Verbal cues, and Handouts Education comprehension: verbalized understanding, returned demonstration, verbal cues required, tactile cues required, and needs further education   HOME EXERCISE PROGRAM:  [id number] + initial HEP given at eval (code not in chart)  ASSESSMENT:  CLINICAL IMPRESSION: The patient's DF has improved to the point where she is more functional and not using the brace. Her pain is  improving significantly since we started needling. We needled 2 spots today. We continue to work on exercises. We performed a trial of gym exercises. She tolerated them well. We will do 1 more follow up then we may D/C to HEP. The patients FOTO has improved significantly.   OBJECTIVE IMPAIRMENTS Abnormal gait, decreased activity tolerance, decreased endurance, decreased mobility, difficulty walking, decreased ROM, decreased strength, and pain.   ACTIVITY LIMITATIONS bending, standing, squatting, sleeping, stairs, transfers, bed mobility, dressing, and locomotion level  PARTICIPATION LIMITATIONS: meal prep, cleaning, laundry, driving, occupation, yard work, and church  PERSONAL FACTORS Time since onset of injury/illness/exacerbation are also affecting patient's functional outcome.   REHAB POTENTIAL: Excellent  CLINICAL DECISION MAKING: Evolving/moderate complexity moderate complexity 2nd to foot drop   EVALUATION COMPLEXITY: Low   GOALS: Goals reviewed with patient? Yes  SHORT TERM GOALS: Target date: 08/23/2022 8/31 Patient will increase right knee flexion to 120 degrees  Baseline: Goal status: progressing   2.  Patient will demonstrate 3/5 right ankle DF strength  Baseline:  Goal status: still only trace firing   3.  Patient will demonstrate full knee extension   Baseline:  Goal status: Improving    LONG TERM GOALS: Target date: 09/13/2022   Patient will walk community distances without pain in order to return to her job  Baseline:  Goal status: INITIAL  2.  Patient will go up/down 6 steps without pain  Baseline:  Goal status: INITIAL  3.  Patient will be independent with complete HEP  Baseline:    PLAN: PT FREQUENCY: 2x/week  PT DURATION: 8 weeks  PLANNED INTERVENTIONS: Therapeutic exercises, Therapeutic activity, Neuromuscular re-education, Balance training, Gait training, Patient/Family education, Self Care, Joint mobilization, Stair training, DME instructions, Aquatic Therapy, Dry Needling, Electrical stimulation, Cryotherapy, Moist heat, Traction, Ultrasound, and Manual therapy  PLAN FOR NEXT SESSION: Russian to anterior tib; knee ROM; SLR; TKE; heel raise; standing march; begin stair training when able.    Carolyne Littles PT DPT  10/17/2022, 10:27 AM

## 2022-10-26 ENCOUNTER — Ambulatory Visit (INDEPENDENT_AMBULATORY_CARE_PROVIDER_SITE_OTHER): Payer: BC Managed Care – PPO | Admitting: Orthopaedic Surgery

## 2022-10-26 ENCOUNTER — Ambulatory Visit (INDEPENDENT_AMBULATORY_CARE_PROVIDER_SITE_OTHER): Payer: BC Managed Care – PPO

## 2022-10-26 ENCOUNTER — Other Ambulatory Visit: Payer: Self-pay

## 2022-10-26 ENCOUNTER — Encounter: Payer: Self-pay | Admitting: Orthopaedic Surgery

## 2022-10-26 ENCOUNTER — Encounter (HOSPITAL_BASED_OUTPATIENT_CLINIC_OR_DEPARTMENT_OTHER): Payer: BC Managed Care – PPO | Admitting: Physical Therapy

## 2022-10-26 DIAGNOSIS — Z96651 Presence of right artificial knee joint: Secondary | ICD-10-CM

## 2022-10-26 DIAGNOSIS — I824Z1 Acute embolism and thrombosis of unspecified deep veins of right distal lower extremity: Secondary | ICD-10-CM

## 2022-10-26 NOTE — Progress Notes (Signed)
The patient is now around 15 weeks status post a right total knee arthroplasty.  Her postoperative course was complicated by foot drop as well as a DVT.  She is on blood thinning medication.  She has made excellent progress now with physical therapy.  She is continue to improve in terms of her nerve function with her foot.  She is not needing to wear an ASO now.  On exam her range of motion is excellent with the right knee and it feels limply stable.  There is some mild foot and ankle swelling to be expected as well as knee swelling but overall she looks much better.  There is still weakness with foot dorsiflexion but it is improving and better even from a month ago.  Standing AP and lateral the right knee show well-seated total knee arthroplasty with no complicating features.  From my standpoint for her knee we do not need to see her back for 6 months unless there are issues.  We will have a standing AP and lateral right knee at that visit.  We do need to obtain a Doppler ultrasound tomorrow of her right lower extremity to assess the progress of her DVT to determine whether or not she can come off of blood thinning medications versus continuing it for another 3 months.

## 2022-10-27 ENCOUNTER — Ambulatory Visit (HOSPITAL_COMMUNITY)
Admission: RE | Admit: 2022-10-27 | Discharge: 2022-10-27 | Disposition: A | Discharge status: Home / Self Care | Payer: BC Managed Care – PPO | Source: Ambulatory Visit | Attending: Orthopaedic Surgery | Admitting: Orthopaedic Surgery

## 2022-10-27 DIAGNOSIS — I824Z1 Acute embolism and thrombosis of unspecified deep veins of right distal lower extremity: Secondary | ICD-10-CM | POA: Diagnosis present

## 2022-11-16 ENCOUNTER — Ambulatory Visit (HOSPITAL_BASED_OUTPATIENT_CLINIC_OR_DEPARTMENT_OTHER): Payer: BC Managed Care – PPO | Admitting: Physical Therapy

## 2022-11-16 ENCOUNTER — Encounter (HOSPITAL_BASED_OUTPATIENT_CLINIC_OR_DEPARTMENT_OTHER): Payer: Self-pay | Admitting: Physical Therapy

## 2022-11-16 DIAGNOSIS — M25661 Stiffness of right knee, not elsewhere classified: Secondary | ICD-10-CM

## 2022-11-16 DIAGNOSIS — M6281 Muscle weakness (generalized): Secondary | ICD-10-CM

## 2022-11-16 DIAGNOSIS — R600 Localized edema: Secondary | ICD-10-CM

## 2022-11-16 DIAGNOSIS — M25561 Pain in right knee: Secondary | ICD-10-CM | POA: Diagnosis not present

## 2022-11-16 DIAGNOSIS — R2689 Other abnormalities of gait and mobility: Secondary | ICD-10-CM

## 2022-11-16 NOTE — Therapy (Signed)
OUTPATIENT PHYSICAL THERAPY LOWER EXTREMITY TREATMENT/discharge    Patient Name: Virginia Fischer MRN: [id number] DOB:Apr 04, 1956, 66 y.o., female Today's Date: 10/04/2022      Progress Note Reporting Period 08/31/2022 to 10/12  See note below for Objective Data and Assessment of Progress/Goals.         Past Medical History:  Diagnosis Date   Arthritis    Past Surgical History:  Procedure Laterality Date   CESAREAN SECTION  1995   TOTAL KNEE ARTHROPLASTY Right 07/14/2022   Procedure: RIGHT TOTAL KNEE ARTHROPLASTY;  Surgeon: Kathryne Hitch, MD;  Location: [facility];  Service: Orthopedics;  Laterality: Right;   Patient Active Problem List   Diagnosis Date Noted   Status post total right knee replacement 07/14/2022   Unilateral primary osteoarthritis, right knee 07/13/2022    PCP: Dr Jarrett Soho   REFERRING PROVIDER: Dr Doneen Poisson  REFERRING DIAG: R TKA   THERAPY DIAG:  No diagnosis found.  Rationale for Evaluation and Treatment Rehabilitation  ONSET DATE: 7/28  SUBJECTIVE:   SUBJECTIVE STATEMENT: Patient continues to ambulate more and more.  She continues to have numbness in her shin.  She feels comfortable with her program.  We will likely discharge today to HEP PERTINENT HISTORY: Right Knee OA  PAIN:  Are you having pain? Yes: NPRS scale:2/10 more when she is walking  but better since needling 10/31 Pain location: back is better, most pain is in the leg  Pain description: aching, feels tight  Aggravating factors: standing, walking Relieving factors: tylenol   PRECAUTIONS: None  WEIGHT BEARING RESTRICTIONS No  FALLS:  Has patient fallen in last 6 months? No  LIVING ENVIRONMENT: 4 steps into the house; Steps to get tot he bedroom 12 OCCUPATION:  Retired  Presenter, broadcasting: Art gallery manager tours at an [employer]   PLOF: Independent  PATIENT GOALS  To get back to regular life   OBJECTIVE:   DIAGNOSTIC FINDINGS:    PATIENT SURVEYS:   FOTO 59% this visit 61% expected   COGNITION:  Overall cognitive status: Within functional limits for tasks assessed     SENSATION: Sensation coming back in the foot.   EDEMA:  Circumferential: 56.7 46.3   POSTURE: No Significant postural limitations  PALPATION: No unexpected tenderness to palpation   LOWER EXTREMITY ROM:  Active ROM Right eval Left eval Right  9/14 Right  10/12  Right    Hip flexion        Hip extension        Hip abduction        Hip adduction        Hip internal rotation        Hip external rotation        Knee flexion 103  125 117 115   Knee extension -6  0 -3 0    Ankle dorsiflexion        Ankle plantarflexion        Ankle inversion        Ankle eversion         (Blank rows = not tested)  Passive ROM Right eval Left eval Right   Hip flexion     Hip extension     Hip abduction     Hip adduction     Hip internal rotation     Hip external rotation     Knee flexion 110  120  Knee extension -5  -3  Ankle dorsiflexion     Ankle plantarflexion  Ankle inversion     Ankle eversion      (Blank rows = not tested)  LOWER EXTREMITY MMT:  MMT Right eval Left eval Right 10/12 Left 10/12  Hip flexion 4+ 5 36.8 39.5  Hip extension      Hip abduction 4 5 62.5 53.1  Hip adduction      Hip internal rotation      Hip external rotation      Knee flexion      Knee extension 3+ 5 43.5 47.7+ unable to break   Ankle dorsiflexion 1 5    Ankle plantarflexion      Ankle inversion      Ankle eversion       (Blank rows = not tested)  FUNCTIONAL TESTS:  Mild use of hands for sit to stand   GAIT: Significant foot drop with ambulation    TODAY'S TREATMENT: 11/30 Reviewed both types of leg presses Cybex 50 pounds reviewed set up of machine 2x20 Life fitness 20 pounds reviewed set up a machine 2x20  Hip abduction machine 40 pounds 3 x 15 Review knee extension machine 2 x 10 10 pounds patient reported some pulling in her right knee.   Patient advised that might not be the the right time to start that machine. NuStep level 5 5 minutes  Manual trigger point release to IT band, patellar mobilization in all planes  11/7 Trigger Point Dry-Needling  Treatment instructions: Expect mild to moderate muscle soreness. S/S of pneumothorax if dry needled over a lung field, and to seek immediate medical attention should they occur. Patient verbalized understanding of these instructions and education.  Patient Consent Given: Yes Education handout provided: Previously provided Muscles treated: lateral lateral distal hamstring  4 spots with a 30x50 needle  Electrical stimulation performed: No Parameters: N/A Treatment response/outcome: less twitch then with previous needles.  10/31  Trigger Point Dry-Needling  Treatment instructions: Expect mild to moderate muscle soreness. S/S of pneumothorax if dry needled over a lung field, and to seek immediate medical attention should they occur. Patient verbalized understanding of these instructions and education.  Patient Consent Given: Yes Education handout provided: Previously provided Muscles treated: lateral lateral distal hamstring  4 spots with a 30x50 needle  Electrical stimulation performed: No Parameters: N/A Treatment response/outcome: less twitch then with previous needles.   Air-ex: narrow base eyes closed 3x20 sec hold   Frward step x15 onto air-ex  Lateral step x20 onto air-ex  Squat 2x15   SLR x30       10/27 Assisted DF with graded assistance x15 minimal DF detected but there was some  Assisted DF with graded assistance x15 minimal DF detected but there was some  SLR 3x10  LAQ x30 2lbs   Reviewed strength measurements   Manual: trigger point release to her IT band and lateral hamstrings; reviewed self trigger point release  Step up with slow controlled movement x15   Step ups 6 inch 2x10  Lateral step up 6 inch 2x10  Step down 4 iinch 2x10   Trigger  Point Dry-Needling  Treatment instructions: Expect mild to moderate muscle soreness. S/S of pneumothorax if dry needled over a lung field, and to seek immediate medical attention should they occur. Patient verbalized understanding of these instructions and education.  Patient Consent Given: Yes Education handout provided: Previously provided Muscles treated: lateral mid quad 4 spots with a 30x50 needle  Electrical stimulation performed: No Parameters: N/A Treatment response/outcome: good twitch with 2-4 needles  Balance arrow base eyes closed 2x30 sec  Tandem stance 2x20 sec hold    PATIENT EDUCATION:  Education details: HEP, symptom management; progression of activity  Person educated: Patient and Spouse Education method: Explanation, Demonstration, Tactile cues, Verbal cues, and Handouts Education comprehension: verbalized understanding, returned demonstration, verbal cues required, tactile cues required, and needs further education   HOME EXERCISE PROGRAM:  [id number] + initial HEP given at eval (code not in chart)  ASSESSMENT:  CLINICAL IMPRESSION: The patient has reached all goals for therapy.  Her active dorsiflexion continues to improve.  She has continues to have some lateral leg pain.  The pain comes and goes.  She also continues to have numbness in her anterior tib area.  She does feel that she is getting progressively better.  She is performing work tasks again on a limited basis.  We reviewed gym exercises today.  She plans on joining a gym.  We reviewed activities that she can do to benefit her knee.  She had a little difficulty with knee extension machine.  She was advised is probably not time yet she may not have quite have the strength for that particular machine yet.  See below for goal specific progress.  OBJECTIVE IMPAIRMENTS Abnormal gait, decreased activity tolerance, decreased endurance, decreased mobility, difficulty walking, decreased ROM, decreased strength, and  pain.   ACTIVITY LIMITATIONS bending, standing, squatting, sleeping, stairs, transfers, bed mobility, dressing, and locomotion level  PARTICIPATION LIMITATIONS: meal prep, cleaning, laundry, driving, occupation, yard work, and church  PERSONAL FACTORS Time since onset of injury/illness/exacerbation are also affecting patient's functional outcome.   REHAB POTENTIAL: Excellent  CLINICAL DECISION MAKING: Evolving/moderate complexity moderate complexity 2nd to foot drop   EVALUATION COMPLEXITY: Low   GOALS: Goals reviewed with patient? Yes  SHORT TERM GOALS: Target date: 08/23/2022 8/31 Patient will increase right knee flexion to 120 degrees  Baseline: Goal status: 125 achieved 11/30  2.  Patient will demonstrate 3/5 right ankle DF strength  Baseline:  Goal status: Achieved 11/30  3.  Patient will demonstrate full knee extension  Baseline:  Goal status: Achieved full knee extension 11/30   LONG TERM GOALS: Target date: 09/13/2022   Patient will walk community distances without pain in order to return to her job  Baseline:  Goal status: Has returned to limited work walking community distances without pain achieve 11/30  2.  Patient will go up/down 6 steps without pain  Baseline:  Goal status: Has been independent with stairs achieved 11/30  3.  Patient will be independent with complete HEP  Baseline: Patient independent with HEP achieved 11/30   PLAN: PT FREQUENCY: 2x/week  PT DURATION: 8 weeks  PLANNED INTERVENTIONS: Therapeutic exercises, Therapeutic activity, Neuromuscular re-education, Balance training, Gait training, Patient/Family education, Self Care, Joint mobilization, Stair training, DME instructions, Aquatic Therapy, Dry Needling, Electrical stimulation, Cryotherapy, Moist heat, Traction, Ultrasound, and Manual therapy  PLAN FOR NEXT SESSION: Russian to anterior tib; knee ROM; SLR; TKE; heel raise; standing march; begin stair training when able.    Lorayne Bender PT DPT  10/17/2022, 10:27 AM

## 2023-02-22 ENCOUNTER — Encounter: Payer: Self-pay | Admitting: Radiology

## 2023-05-09 ENCOUNTER — Ambulatory Visit
Admission: RE | Admit: 2023-05-09 | Discharge: 2023-05-09 | Disposition: A | Discharge status: Home / Self Care | Payer: BC Managed Care – PPO | Source: Ambulatory Visit | Attending: Family Medicine | Admitting: Family Medicine

## 2023-05-09 ENCOUNTER — Other Ambulatory Visit: Payer: Self-pay | Admitting: Family Medicine

## 2023-05-09 DIAGNOSIS — R0989 Other specified symptoms and signs involving the circulatory and respiratory systems: Secondary | ICD-10-CM

## 2023-06-15 ENCOUNTER — Other Ambulatory Visit: Payer: Self-pay | Admitting: Family Medicine

## 2023-06-15 DIAGNOSIS — Z1231 Encounter for screening mammogram for malignant neoplasm of breast: Secondary | ICD-10-CM

## 2023-06-20 ENCOUNTER — Ambulatory Visit: Payer: BC Managed Care – PPO

## 2023-07-10 ENCOUNTER — Other Ambulatory Visit (INDEPENDENT_AMBULATORY_CARE_PROVIDER_SITE_OTHER): Payer: BC Managed Care – PPO

## 2023-07-10 ENCOUNTER — Encounter: Payer: Self-pay | Admitting: Orthopaedic Surgery

## 2023-07-10 ENCOUNTER — Ambulatory Visit: Payer: BC Managed Care – PPO | Admitting: Orthopaedic Surgery

## 2023-07-10 DIAGNOSIS — Z96651 Presence of right artificial knee joint: Secondary | ICD-10-CM

## 2023-07-10 MED ORDER — CELECOXIB 200 MG PO CAPS: 200.0000 mg | ORAL_CAPSULE | Freq: Two times a day (BID) | ORAL | 1 refills | Status: AC | PRN

## 2023-07-10 NOTE — Progress Notes (Signed)
The patient is very well-known to me.  She is a 67 year old female who is now a year out from a right total knee arthroplasty.  She is having still significant pain from the lateral aspect of her knee down to her foot and ankle.  She had had some neurologic symptoms before after surgery and even some foot drop.  Her strength is back and the knee is moving well and she denies any instability but she still has significant pain in that leg.  It is not so much in the knee joint but it radiates from the lateral aspect of her knee to her ankle.  She has traveled out of the country and did well from that standpoint but she is still having significant issues with her right operative knee.  She has been on Neurontin and Lyrica and she is off of those medications.  Recently she had COVID that turned into pneumonia.  Surprising the steroids that she was on did not help the leg pain at all.  She is had a history of a DVT.  That has been resolved and she is off of blood thinning medications.  She is not taking any anti-inflammatories at the moment.  Examination of her right knee shows full flexion and extension.  There is no blocks to rotation or extension and flexion.  There is no effusion.  The knee is cool.  It feels ligamentously stable.  She does have irritation around the lateral aspect of her knee joint more so in the metaphyseal area and around the peroneal nerve.  She is able to flex her foot and ankle.  I would like to start Celebrex for her as an anti-inflammatory and have her try Voltaren gel over that area of the lateral proximal tibia 2-3 times daily.  I would like to send her to Dr. Alvester Morin for EMG studies of the right lower extremity to potentially ascertain if there is irritation of the peroneal nerve.  We will then have Dr. Alvester Morin get her back to [place] after that study.  She agrees with this treatment plan.  Of note x-rays of the right knee today show well-seated implant with good alignment and no evidence of  loosening.

## 2023-07-11 ENCOUNTER — Other Ambulatory Visit: Payer: Self-pay

## 2023-07-11 ENCOUNTER — Ambulatory Visit
Admission: RE | Admit: 2023-07-11 | Discharge: 2023-07-11 | Disposition: A | Discharge status: Home / Self Care | Payer: BC Managed Care – PPO | Source: Ambulatory Visit | Attending: Family Medicine | Admitting: Family Medicine

## 2023-07-11 DIAGNOSIS — Z1231 Encounter for screening mammogram for malignant neoplasm of breast: Secondary | ICD-10-CM

## 2023-07-11 DIAGNOSIS — R1031 Right lower quadrant pain: Secondary | ICD-10-CM

## 2023-07-18 ENCOUNTER — Ambulatory Visit: Payer: BC Managed Care – PPO | Admitting: Physical Medicine and Rehabilitation

## 2023-07-18 DIAGNOSIS — R202 Paresthesia of skin: Secondary | ICD-10-CM

## 2023-07-18 DIAGNOSIS — M25561 Pain in right knee: Secondary | ICD-10-CM | POA: Diagnosis not present

## 2023-07-18 DIAGNOSIS — G8929 Other chronic pain: Secondary | ICD-10-CM

## 2023-07-18 DIAGNOSIS — M21371 Foot drop, right foot: Secondary | ICD-10-CM

## 2023-07-18 DIAGNOSIS — Z96651 Presence of right artificial knee joint: Secondary | ICD-10-CM | POA: Diagnosis not present

## 2023-07-18 NOTE — Progress Notes (Signed)
Functional Pain Scale - descriptive words and definitions  Uncomfortable (3)  Pain is present but can complete all ADL's/sleep is slightly affected and passive distraction only gives marginal relief. Mild range order  Average Pain 4, but can vary depending on activity  Right knee pain that radiates to the ankle. Right foot feels tight when flexing it up. Tingling in lower right leg

## 2023-07-18 NOTE — Procedures (Signed)
EMG & NCV Findings: Evaluation of the right Dp Br Fibular motor nerve showed prolonged distal onset latency (Poplit, 6.0 ms) and decreased conduction velocity (Poplit-Fib Head, 38 m/s).  The right fibular motor nerve showed reduced amplitude (0.1 mV) and decreased conduction velocity (Poplt-B Fib, 16 m/s).  The right superficial fibular sensory nerve showed no response (14 cm).  The right sural sensory nerve showed prolonged distal peak latency (4.4 ms) and decreased conduction velocity (Calf-Lat Mall, 32 m/s).  All remaining nerves (as indicated in the following tables) were within normal limits.    Needle evaluation of the right anterior tibialis muscle showed increased insertional activity, widespread spontaneous activity, very increased polyphasic potentials, and diminished recruitment.  The right Fibularis Longus muscle showed increased insertional activity, moderately increased spontaneous activity, very increased polyphasic potentials, and diminished recruitment.  All remaining muscles (as indicated in the following table) showed no evidence of electrical instability.    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  There is clear evidence of reinnervation with polyphasic needle EMG findings and clinically good strength recovery with dorsiflexion.  Prognostically she likely may continue to have numbness and tingling but this may improve.   There is no significant electrodiagnostic evidence of any other focal nerve entrapment, lumbosacral plexopathy or lumbar radiculopathy.   Recommendations: 1.  Follow-up with referring physician. 2.  Continue current management of symptoms.  Strength will likely continue to improve on the right lower extremity with sprouting effect for the motor units.  Unfortunately may have prolonged if not permanent numbness and/or  dysesthesia.  ___________________________ Naaman Plummer [facility] Board Certified, American Board of Physical Medicine and Rehabilitation    Nerve Conduction Studies Anti Sensory Summary Table   Stim Site NR Peak (ms) Norm Peak (ms) P-T Amp (V) Norm P-T Amp Site1 Site2 Delta-P (ms) Dist (cm) Vel (m/s) Norm Vel (m/s)  Right Saphenous Anti Sensory (Ant Med Mall)  28C  14cm    4.2 <4.4 13.1 >2 14cm Ant Med Mall 4.2 0.0  >32  Right Sup Fibular Anti Sensory (Ant Lat Mall)  28C  14 cm *NR  <4.4  >5.0 14 cm Ant Lat Mall  14.0  >32  Right Sural Anti Sensory (Lat Mall)  28C  Calf    *4.4 <4.0 7.0 >5.0 Calf Lat Mall 4.4 14.0 *32 >35   Motor Summary Table   Stim Site NR Onset (ms) Norm Onset (ms) O-P Amp (mV) Norm O-P Amp Site1 Site2 Delta-0 (ms) Dist (cm) Vel (m/s) Norm Vel (m/s)  Right Dp Br Fibular Motor (AntTibialis)  28C  Fib Head    3.4 <4.2 1.1  Poplit Fib Head 2.6 10.0 *38 >40.5  Poplit    *6.0 <5.7 2.6         Right Fibular Motor (Ext Dig Brev)  27.9C  Ankle    5.5 <6.1 *0.1 >2.5 B Fib Ankle 5.1 45.0 88 >38  B Fib    10.6  0.0  Poplt B Fib 6.2 10.0 *16 >40  Poplt    16.8  0.1         Right Tibial Motor (Abd Hall Brev)  28.1C  Ankle    5.5 <6.1 6.2 >3.0 Knee Ankle 7.1 45.0 63 >35  Knee    12.6  2.1          EMG   Side Muscle Nerve Root Ins Act Fibs Psw  Amp Dur Poly Recrt Int Dennie Bible Comment  Right AntTibialis Dp Br Peron L4-5 *Incr *4+ *4+ Nml Nml *3+ *Reduced Nml   Right Fibularis Longus  Sup Br Peron L5-S1 *Incr *2+ *2+ Nml Nml *3+ *Reduced Nml   Right MedGastroc Tibial S1-2 Nml Nml Nml Nml Nml 0 Nml Nml   Right VastusMed Femoral L2-4 Nml Nml Nml Nml Nml 0 Nml Nml   Right BicepsFemS Sciatic L5-S1 Nml Nml Nml Nml Nml 0 Nml Nml     Nerve Conduction Studies Anti Sensory Left/Right Comparison   Stim Site L Lat (ms) R Lat (ms) L-R Lat (ms) L Amp (V) R Amp (V) L-R Amp (%) Site1 Site2 L Vel (m/s) R Vel (m/s) L-R Vel (m/s)  Saphenous Anti Sensory (Ant Med Mall)  28C  14cm  4.2    13.1  14cm Ant Med Mall     Sup Fibular Anti Sensory (Ant Lat Mall)  28C  14 cm       14 cm Ant Lat Mall     Sural Anti Sensory (Lat Mall)  28C  Calf  *4.4   7.0  Calf Lat Mall  *32    Motor Left/Right Comparison   Stim Site L Lat (ms) R Lat (ms) L-R Lat (ms) L Amp (mV) R Amp (mV) L-R Amp (%) Site1 Site2 L Vel (m/s) R Vel (m/s) L-R Vel (m/s)  Dp Br Fibular Motor (AntTibialis)  28C  Fib Head  3.4   1.1  Poplit Fib Head  *38   Poplit  *6.0   2.6        Fibular Motor (Ext Dig Brev)  27.9C  Ankle  5.5   *0.1  B Fib Ankle  88   B Fib  10.6   0.0  Poplt B Fib  *16   Poplt  16.8   0.1        Tibial Motor (Abd Hall Brev)  28.1C  Ankle  5.5   6.2  Knee Ankle  63   Knee  12.6   2.1           Waveforms:

## 2023-07-20 NOTE — Progress Notes (Signed)
Virginia Fischer - 67 y.o. female MRN [id number]  Date of birth: November 10, 1956  Office Visit Note: Visit Date: 07/18/2023 PCP: Jarrett Soho, PA-C Referred by: Kathryne Hitch*  Subjective: Chief Complaint  Patient presents with   Right Knee - Pain   HPI:  Virginia Fischer is a 67 y.o. female who comes in today for evaluation and management at the request of Dr. Doneen Poisson for continued pain and numbness and initial weakness but improved weakness of the right lower extremity from the knee down.  She is status post knee replacement but immediately after knee replacement had significant numbness pain and weakness really in a pretty clear fibular nerve distribution laterally to the knee and down into the foot.  No history of spine problems or spine surgery.  No history of left-sided complaints.  Over time she has gained a lot of strength back in the foot and is not using an AFO.  She continues to have numbness paresthesia and pain however.  She has had therapy and medication management and time.   I spent more than 30 minutes speaking face-to-face with the patient with 50% of the time in counseling and discussing coordination of care.    Review of Systems  Musculoskeletal:  Positive for joint pain.  Neurological:  Positive for tingling and focal weakness.  All other systems reviewed and are negative.  Otherwise per HPI.  Assessment & Plan: Visit Diagnoses:    ICD-10-CM   1. Paresthesia of skin  R20.2 NCV with EMG (electromyography)    2. Chronic pain of right knee  M25.561    G89.29     3. Status post right knee replacement  Z96.651     4. Right foot drop  M21.371       Plan: Impression: Significant numbness pain and weakness on the right lower extremity from the knee down in a pretty classic fibular nerve distribution but cannot rule out L5 radicular problem although no history of back issues.  Electrodiagnostic performed.  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  There is clear evidence of reinnervation with polyphasic needle EMG findings and clinically good strength recovery with dorsiflexion.  Prognostically she likely may continue to have numbness and tingling but this may improve.   There is no significant electrodiagnostic evidence of any other focal nerve entrapment, lumbosacral plexopathy or lumbar radiculopathy.   Recommendations: 1.  Follow-up with referring physician. 2.  Continue current management of symptoms.  Strength will likely continue to improve on the right lower extremity with sprouting effect for the motor units.  Unfortunately may have prolonged if not permanent numbness and/or dysesthesia.  Meds & Orders: No orders of the defined types were placed in this encounter.   Orders Placed This Encounter  Procedures   NCV with EMG (electromyography)    Follow-up: Return for Doneen Poisson, MD.   Procedures: No procedures performed  EMG & NCV Findings: Evaluation of the right Dp Br Fibular motor nerve showed prolonged distal onset latency (Poplit, 6.0 ms) and decreased conduction velocity (Poplit-Fib Head, 38 m/s).  The right fibular motor nerve showed reduced amplitude (0.1 mV) and decreased conduction velocity (Poplt-B Fib, 16 m/s).  The right superficial fibular sensory nerve showed no response (14 cm).  The right sural sensory nerve showed prolonged distal peak latency (4.4 ms) and decreased conduction velocity (Calf-Lat Mall, 32 m/s).  All  remaining nerves (as indicated in the following tables) were within normal limits.    Needle evaluation of the right anterior tibialis muscle showed increased insertional activity, widespread spontaneous activity, very increased polyphasic potentials, and diminished recruitment.  The right Fibularis Longus muscle showed increased insertional activity,  moderately increased spontaneous activity, very increased polyphasic potentials, and diminished recruitment.  All remaining muscles (as indicated in the following table) showed no evidence of electrical instability.    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  There is clear evidence of reinnervation with polyphasic needle EMG findings and clinically good strength recovery with dorsiflexion.  Prognostically she likely may continue to have numbness and tingling but this may improve.   There is no significant electrodiagnostic evidence of any other focal nerve entrapment, lumbosacral plexopathy or lumbar radiculopathy.   Recommendations: 1.  Follow-up with referring physician. 2.  Continue current management of symptoms.  Strength will likely continue to improve on the right lower extremity with sprouting effect for the motor units.  Unfortunately may have prolonged if not permanent numbness and/or dysesthesia.  ___________________________ Naaman Plummer [facility] Board Certified, American Board of Physical Medicine and Rehabilitation    Nerve Conduction Studies Anti Sensory Summary Table   Stim Site NR Peak (ms) Norm Peak (ms) P-T Amp (V) Norm P-T Amp Site1 Site2 Delta-P (ms) Dist (cm) Vel (m/s) Norm Vel (m/s)  Right Saphenous Anti Sensory (Ant Med Mall)  28C  14cm    4.2 <4.4 13.1 >2 14cm Ant Med Mall 4.2 0.0  >32  Right Sup Fibular Anti Sensory (Ant Lat Mall)  28C  14 cm *NR  <4.4  >5.0 14 cm Ant Lat Mall  14.0  >32  Right Sural Anti Sensory (Lat Mall)  28C  Calf    *4.4 <4.0 7.0 >5.0 Calf Lat Mall 4.4 14.0 *32 >35   Motor Summary Table   Stim Site NR Onset (ms) Norm Onset (ms) O-P Amp (mV) Norm O-P Amp Site1 Site2 Delta-0 (ms) Dist (cm) Vel (m/s) Norm Vel (m/s)  Right Dp Br Fibular Motor (AntTibialis)  28C  Fib Head    3.4 <4.2 1.1  Poplit Fib  Head 2.6 10.0 *38 >40.5  Poplit    *6.0 <5.7 2.6         Right Fibular Motor (Ext Dig Brev)  27.9C  Ankle    5.5 <6.1 *0.1 >2.5 B Fib Ankle 5.1 45.0 88 >38  B Fib    10.6  0.0  Poplt B Fib 6.2 10.0 *16 >40  Poplt    16.8  0.1         Right Tibial Motor (Abd Hall Brev)  28.1C  Ankle    5.5 <6.1 6.2 >3.0 Knee Ankle 7.1 45.0 63 >35  Knee    12.6  2.1          EMG   Side Muscle Nerve Root Ins Act Fibs Psw Amp Dur Poly Recrt Int Dennie Bible Comment  Right AntTibialis Dp Br Peron L4-5 *Incr *4+ *4+ Nml Nml *3+ *Reduced Nml   Right Fibularis Longus  Sup Br Peron L5-S1 *Incr *2+ *2+ Nml Nml *3+ *Reduced Nml   Right MedGastroc Tibial S1-2 Nml Nml Nml Nml Nml 0 Nml Nml   Right VastusMed Femoral L2-4 Nml Nml Nml Nml Nml 0 Nml Nml   Right BicepsFemS Sciatic L5-S1 Nml Nml Nml Nml Nml 0 Nml Nml  Nerve Conduction Studies Anti Sensory Left/Right Comparison   Stim Site L Lat (ms) R Lat (ms) L-R Lat (ms) L Amp (V) R Amp (V) L-R Amp (%) Site1 Site2 L Vel (m/s) R Vel (m/s) L-R Vel (m/s)  Saphenous Anti Sensory (Ant Med Mall)  28C  14cm  4.2   13.1  14cm Ant Med Mall     Sup Fibular Anti Sensory (Ant Lat Mall)  28C  14 cm       14 cm Ant Lat Mall     Sural Anti Sensory (Lat Mall)  28C  Calf  *4.4   7.0  Calf Lat Mall  *32    Motor Left/Right Comparison   Stim Site L Lat (ms) R Lat (ms) L-R Lat (ms) L Amp (mV) R Amp (mV) L-R Amp (%) Site1 Site2 L Vel (m/s) R Vel (m/s) L-R Vel (m/s)  Dp Br Fibular Motor (AntTibialis)  28C  Fib Head  3.4   1.1  Poplit Fib Head  *38   Poplit  *6.0   2.6        Fibular Motor (Ext Dig Brev)  27.9C  Ankle  5.5   *0.1  B Fib Ankle  88   B Fib  10.6   0.0  Poplt B Fib  *16   Poplt  16.8   0.1        Tibial Motor (Abd Hall Brev)  28.1C  Ankle  5.5   6.2  Knee Ankle  63   Knee  12.6   2.1           Waveforms:              Clinical History: No specialty comments available.     Objective:  VS:  HT:    WT:   BMI:     BP:   HR: bpm  TEMP: ( )  RESP:   Physical Exam Vitals and nursing note reviewed.  Constitutional:      General: She is not in acute distress.    Appearance: Normal appearance. She is well-developed. She is not ill-appearing.  HENT:     Head: Normocephalic and atraumatic.  Eyes:     Conjunctiva/sclera: Conjunctivae normal.     Pupils: Pupils are equal, round, and reactive to light.  Cardiovascular:     Rate and Rhythm: Normal rate.     Pulses: Normal pulses.  Pulmonary:     Effort: Pulmonary effort is normal.  Musculoskeletal:        General: Tenderness present.     Right lower leg: No edema.     Left lower leg: No edema.     Comments: Patient ambulates without aid without AFO with out foot slapping or hip hiking etc.  Examination of both lower extremities show some atrophy of the right EDB but not left EDB.  She has pretty significant dysesthesia and numbness to light touch in the right fibular nerve distribution particularly between the first dorsal webspace on the right.  Normal sensation on the left.  Some pain to palpation along the skin laterally of the right knee.  Scar is well-healed without any induration or rash etc.  She has good motion of the right knee.  She has some strength decrement right to left with dorsiflexion but it is actually on the right essentially 5 out of 5.  Some weakness with toe extension.  Skin:    General: Skin is warm and dry.     Findings: No erythema  or rash.  Neurological:     General: No focal deficit present.     Mental Status: She is alert and oriented to person, place, and time.     Cranial Nerves: No cranial nerve deficit.     Sensory: Sensory deficit present.     Motor: Weakness present. No abnormal muscle tone.     Coordination: Coordination normal.     Gait: Gait abnormal.  Psychiatric:        Mood and Affect: Mood normal.        Behavior: Behavior normal.      Imaging: No results found.

## 2024-04-09 ENCOUNTER — Other Ambulatory Visit: Payer: Self-pay | Admitting: Family Medicine

## 2024-04-09 DIAGNOSIS — E2839 Other primary ovarian failure: Secondary | ICD-10-CM

## 2024-04-09 DIAGNOSIS — Z1231 Encounter for screening mammogram for malignant neoplasm of breast: Secondary | ICD-10-CM

## 2024-04-10 ENCOUNTER — Other Ambulatory Visit: Payer: Self-pay | Admitting: Family Medicine

## 2024-04-10 DIAGNOSIS — R1031 Right lower quadrant pain: Secondary | ICD-10-CM

## 2024-04-17 ENCOUNTER — Ambulatory Visit
Admission: RE | Admit: 2024-04-17 | Discharge: 2024-04-17 | Disposition: A | Discharge status: Home / Self Care | Payer: Self-pay | Source: Ambulatory Visit | Attending: Family Medicine | Admitting: Family Medicine

## 2024-04-17 DIAGNOSIS — R1031 Right lower quadrant pain: Secondary | ICD-10-CM

## 2024-05-23 LAB — COLOGUARD: COLOGUARD: NEGATIVE

## 2024-07-11 ENCOUNTER — Ambulatory Visit
Admission: RE | Admit: 2024-07-11 | Discharge: 2024-07-11 | Disposition: A | Discharge status: Home / Self Care | Payer: Self-pay | Source: Ambulatory Visit | Attending: Family Medicine | Admitting: Family Medicine

## 2024-07-11 DIAGNOSIS — Z1231 Encounter for screening mammogram for malignant neoplasm of breast: Secondary | ICD-10-CM

## 2024-10-14 DIAGNOSIS — Z6833 Body mass index (BMI) 33.0-33.9, adult: Secondary | ICD-10-CM | POA: Diagnosis not present

## 2024-10-14 DIAGNOSIS — H9319 Tinnitus, unspecified ear: Secondary | ICD-10-CM | POA: Diagnosis not present

## 2024-10-17 ENCOUNTER — Encounter (INDEPENDENT_AMBULATORY_CARE_PROVIDER_SITE_OTHER): Payer: Self-pay

## 2024-12-08 ENCOUNTER — Encounter (INDEPENDENT_AMBULATORY_CARE_PROVIDER_SITE_OTHER): Payer: Self-pay

## 2024-12-08 ENCOUNTER — Ambulatory Visit (INDEPENDENT_AMBULATORY_CARE_PROVIDER_SITE_OTHER)

## 2024-12-08 VITALS — BP 127/85 | HR 78 | Temp 98.4°F | Ht 64.0 in | Wt 190.0 lb

## 2024-12-08 DIAGNOSIS — H9313 Tinnitus, bilateral: Secondary | ICD-10-CM | POA: Diagnosis not present

## 2024-12-08 DIAGNOSIS — H9193 Unspecified hearing loss, bilateral: Secondary | ICD-10-CM

## 2024-12-08 NOTE — Progress Notes (Signed)
 Dear Dr. Katina, Here is my assessment for our mutual patient, Virginia Fischer. Thank you for allowing me the opportunity to care for your patient. Please do not hesitate to contact me should you have any other questions. Sincerely, Dr. Hadassah Parody  Otolaryngology Clinic Note Referring provider: Dr. Katina HPI:   Initial HPI (12/08/2024) Discussed the use of AI scribe software for clinical note transcription with the patient, who gave verbal consent to proceed.  History of Present Illness Virginia Fischer is a 68 year old female with bilateral tinnitus who presents for evaluation of persistent tinnitus.  Tinnitus - Chronic, bilateral ringing in both ears since early 2020, slowly worsening over time.  Tinnitus is constant with intermittent exacerbations.  Similar to a mosquito buzzing.   - No associated otalgia, otorrhea, or vertigo - No history of otitis media, ear surgery, or tympanostomy tube placement  Sensorineural hearing loss - Audiologic evaluation in early 2020 demonstrated sensorineural hearing loss per patient report (previously followed with Dr. Thaddeus) and was told her tinnitus is likely due to the hearing loss. - Not using hearing aids or assistive devices - Concerned about potential progression of hearing loss due to strong family history of hearing loss in her parents.    Independent Review of Additional Tests or Records:  Referral note 10/15/24 Charmaine Katina, PA-C: pt reporting tinnitus and hearing changes   BMP 07/15/2022 -  creatinine of 0.66  PMH/Meds/All/SocHx/FamHx/ROS:   Past Medical History:  Diagnosis Date   Arthritis      Past Surgical History:  Procedure Laterality Date   CESAREAN SECTION  1995   TOTAL KNEE ARTHROPLASTY Right 07/14/2022   Procedure: RIGHT TOTAL KNEE ARTHROPLASTY;  Surgeon: Vernetta Lonni GRADE, MD;  Location: [facility];  Service: Orthopedics;  Laterality: Right;    Family History  Problem Relation Age of Onset    Hypertension Mother    Arrhythmia Mother    Hypertension Sister    Heart failure Maternal Grandmother    Cancer Maternal Grandfather    Cancer Paternal Grandmother    Stroke Paternal Grandfather    Breast cancer Neg Hx      Social Connections: Not on file     Current Outpatient Medications  Medication Instructions   acetaminophen  (TYLENOL ) 650 mg, BH-each morning   celecoxib  (CELEBREX ) 200 mg, Oral, 2 times daily between meals PRN   cholecalciferol  (VITAMIN D3) 1,000 Units, Daily   Multiple Vitamins-Minerals (CENTRUM SILVER 50+WOMEN) TABS 2 tablets, Daily   Multiple Vitamins-Minerals (PRESERVISION AREDS 2) CAPS 2 capsules, Daily   Pyridoxine HCl (VITAMIN B6) 100 MG TABS      Physical Exam:   BP 127/85 (BP Location: Left Arm, Patient Position: Sitting)   Pulse 78   Temp 98.4 F (36.9 C)   Ht 5' 4 (1.626 m)   Wt 190 lb (86.2 kg)   LMP 07/19/2012   SpO2 95%   BMI 32.61 kg/m   Salient findings:  CN II-XII intact with the exception of cranial nerve VIII as above  Bilateral EAC clear and TM intact with well pneumatized middle ear spaces No lesions of oral cavity/oropharynx; dentition fair No obviously palpable neck masses/lymphadenopathy/thyromegaly No respiratory distress or stridor  Seprately Identifiable Procedures:  Prior to initiating any procedures, risks/benefits/alternatives were explained to the patient and verbal consent obtained.  Procedure (12/08/2024): Bilateral ear microscopy using microscope (CPT [id number]) Pre-procedure diagnosis: Tinnitus, hearing loss Post-procedure diagnosis: same Indication: see above; given patient's otologic complaints and history, for improved and comprehensive examination of external ear  and tympanic membrane, bilateral otologic examination using microscope was performed. Prior to proceeding, verbal consent was obtained after discussion of R/B/A  Procedure: Patient was placed semi-recumbent. Both ear canals were examined using the  microscope with findings above. Patient tolerated the procedure well.   Impression & Plans:  Maribell Demeo is a 68 y.o. female with   1. Tinnitus of both ears   2. Bilateral hearing loss, unspecified hearing loss type     Assessment and Plan Assessment & Plan Bilateral tinnitus Chronic, tinnitus likely secondary to age-related hearing loss, bilateral.  No ear pain, infections, or prior otologic surgery. Low risk for intracranial pathology. - Ordered updated audiometry to evaluate current hearing status and confirm etiology of tinnitus. 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 Family history of significant hearing loss.  - Ordered updated audiometry to assess current degree of hearing loss  - Discussed hearing aids as potential intervention to improve hearing and possibly reduce tinnitus, noting advances in hearing aid technology but this will depend on her audiogram results.  Follow-up with audiogram   See below regarding exact medications prescribed this encounter including dosages and route: No orders of the defined types were placed in this encounter.    Thank you for allowing me the opportunity to care for your patient. Please do not hesitate to contact me should you have any other questions.  Sincerely, Hadassah Parody, MD Otolaryngologist (ENT), [facility] Phone: [phone] Fax: [phone]  MDM:  Level 4 Complexity/Problems addressed: 4-chronic worsening problem Data complexity: 4-independent review of note, lab, ordered audiogram - Morbidity: Low - Prescription Drug prescribed or managed: No

## 2024-12-12 ENCOUNTER — Telehealth (INDEPENDENT_AMBULATORY_CARE_PROVIDER_SITE_OTHER): Payer: Self-pay

## 2024-12-12 NOTE — Telephone Encounter (Signed)
 Called patient to get her appointment with Dr. Greggory rescheduled patient did not answer I left a voicemail explaining Dr. Greggory was not going to be in office and to give us  a call back to reschedule. I left call back number as well.

## 2024-12-16 ENCOUNTER — Ambulatory Visit (HOSPITAL_BASED_OUTPATIENT_CLINIC_OR_DEPARTMENT_OTHER)
Admission: RE | Admit: 2024-12-16 | Discharge: 2024-12-16 | Disposition: A | Discharge status: Home / Self Care | Source: Ambulatory Visit | Attending: Family Medicine | Admitting: Family Medicine

## 2024-12-16 DIAGNOSIS — E2839 Other primary ovarian failure: Secondary | ICD-10-CM | POA: Insufficient documentation

## 2024-12-26 ENCOUNTER — Other Ambulatory Visit: Payer: Self-pay

## 2025-01-19 ENCOUNTER — Ambulatory Visit (INDEPENDENT_AMBULATORY_CARE_PROVIDER_SITE_OTHER): Admitting: Audiology

## 2025-01-19 ENCOUNTER — Ambulatory Visit (INDEPENDENT_AMBULATORY_CARE_PROVIDER_SITE_OTHER)

## 2025-01-23 ENCOUNTER — Encounter (INDEPENDENT_AMBULATORY_CARE_PROVIDER_SITE_OTHER): Payer: Self-pay

## 2025-01-23 ENCOUNTER — Ambulatory Visit (INDEPENDENT_AMBULATORY_CARE_PROVIDER_SITE_OTHER)

## 2025-01-23 ENCOUNTER — Ambulatory Visit (INDEPENDENT_AMBULATORY_CARE_PROVIDER_SITE_OTHER): Admitting: Audiology

## 2025-01-23 VITALS — BP 137/84 | HR 68

## 2025-01-23 DIAGNOSIS — H903 Sensorineural hearing loss, bilateral: Secondary | ICD-10-CM

## 2025-01-23 DIAGNOSIS — H9313 Tinnitus, bilateral: Secondary | ICD-10-CM

## 2025-01-23 NOTE — Progress Notes (Signed)
 Dear Dr. Katina, Here is my assessment for our mutual patient, Virginia Fischer. Thank you for allowing me the opportunity to care for your patient. Please do not hesitate to contact me should you have any other questions. Sincerely, Dr. Hadassah Parody  Otolaryngology Clinic Note Referring provider: Dr. Katina HPI:   Initial HPI (12/08/24)  Virginia Fischer is a 69 year old female with bilateral tinnitus who presents for evaluation of persistent tinnitus.  Tinnitus - Chronic, bilateral ringing in both ears since early 2020, slowly worsening over time.  Tinnitus is constant with intermittent exacerbations.  Similar to a mosquito buzzing.   - No associated otalgia, otorrhea, or vertigo - No history of otitis media, ear surgery, or tympanostomy tube placement  Sensorineural hearing loss - Audiologic evaluation in early 2020 demonstrated sensorineural hearing loss per patient report (previously followed with Dr. Thaddeus) and was told her tinnitus is likely due to the hearing loss. - Not using hearing aids or assistive devices - Concerned about potential progression of hearing loss due to strong family history of hearing loss in her parents.  --------------------------------------------------------- 01/23/2025  Here for follow-up after audiogram.  She also has an audiogram from 2020 for us  to review.  No major changes in her symptoms.  Tinnitus is variable and primarily bothersome at nighttime when going to bed.  She has had some gradual decline in hearing but no significant impact in her ability to communicate and she remains social.    Independent Review of Additional Tests or Records:  Referral note 10/15/24 Charmaine Katina, PA-C: pt reporting tinnitus and hearing changes   BMP 07/15/2022 -  creatinine of 0.66  01/14/19 Audiogram was independently reviewed and interpreted by me and it reveals Right ear: Normal sloping to moderate sensorineural loss; 100% word interpretation at 50dB;  type A tympanogram Left ear: Normal sloping to moderate sensorineural loss; 96% word interpretation at 50dB; type A tympanogram    01/23/25 Audiogram was independently reviewed and interpreted by me and it reveals Right ear: Mild sloping to moderate sensorineural loss; 100% word interpretation at 70dB; type A tympanogram Left ear: Mild sloping to moderate sensorineural loss; 96% word interpretation at 70dB; type A tympanogram      PMH/Meds/All/SocHx/FamHx/ROS:   Past Medical History:  Diagnosis Date   Arthritis      Past Surgical History:  Procedure Laterality Date   CESAREAN SECTION  1995   TOTAL KNEE ARTHROPLASTY Right 07/14/2022   Procedure: RIGHT TOTAL KNEE ARTHROPLASTY;  Surgeon: Vernetta Lonni GRADE, MD;  Location: [facility];  Service: Orthopedics;  Laterality: Right;    Family History  Problem Relation Age of Onset   Hypertension Mother    Arrhythmia Mother    Hypertension Sister    Heart failure Maternal Grandmother    Cancer Maternal Grandfather    Cancer Paternal Grandmother    Stroke Paternal Grandfather    Breast cancer Neg Hx      Social Connections: Not on file     Current Outpatient Medications  Medication Instructions   acetaminophen  (TYLENOL ) 650 mg, BH-each morning   celecoxib  (CELEBREX ) 200 mg, Oral, 2 times daily between meals PRN   cholecalciferol  (VITAMIN D3) 1,000 Units, Daily   Multiple Vitamins-Minerals (CENTRUM SILVER 50+WOMEN) TABS 2 tablets, Daily   Multiple Vitamins-Minerals (PRESERVISION AREDS 2) CAPS 2 capsules, Daily   Pyridoxine HCl (VITAMIN B6) 100 MG TABS      Physical Exam:   BP 137/84 (BP Location: Left Arm, Patient Position: Sitting)   Pulse 68  LMP 07/19/2012   SpO2 96%   Salient findings:  CN II-XII intact with the exception of cranial nerve VIII as above  Bilateral EAC clear and TM intact with well pneumatized middle ear spaces No obviously palpable neck masses/lymphadenopathy/thyromegaly No respiratory distress or  stridor  Seprately Identifiable Procedures:  Prior to initiating any procedures, risks/benefits/alternatives were explained to the patient and verbal consent obtained. None   Impression & Plans:  [name] is a 69 y.o. female with   1. Tinnitus of both ears   2. Sensorineural hearing loss (SNHL) of both ears      Assessment and Plan Assessment & Plan  Sensorineural hearing loss chronic sensorineural hearing loss with slight progression in mid frequencies over six years, consistent with presbycusis. Hearing thresholds and discrimination scores remain appropriate for her age, without concerning asymmetry or rapid progression. Hearing loss does not currently interfere with social interactions or daily functioning. - Reviewed most recent audiogram and compared to prior results. - Discussed indications for hearing aids, including when hearing loss impairs social engagement or daily activities, and that hearing aids may also ameliorate tinnitus if hearing loss worsens. - Provided information on hearing aid acquisition options - Encouraged return for reassessment if she experiences significant worsening of hearing. - Encouraged to follow-up for repeat audiogram and assessment if she notices further change in her hearing  Bilateral tinnitus Chronic tinnitus, likely secondary to sensorineural hearing loss, remains manageable with current coping strategies. No new or worsening symptoms reported. - Discussed that hearing aids may reduce tinnitus - Provided information regarding a local tinnitus specialist at [facility] if she becomes interested - Advised to seek further evaluation if tinnitus becomes more bothersome or impacts quality of life.   See below regarding exact medications prescribed this encounter including dosages and route: No orders of the defined types were placed in this encounter.    Thank you for allowing me the opportunity to care for your patient. Please do not hesitate  to contact me should you have any other questions.  Sincerely, Hadassah Parody, MD Otolaryngologist (ENT), [facility] Phone: [phone] Fax: [phone]  MDM:  Level 4 Complexity/Problems addressed: 4-multiple chronic problems Data complexity: 4-independent review of 2 audiograms - Morbidity: Low - Prescription Drug prescribed or managed: No

## 2025-01-23 NOTE — Progress Notes (Signed)
" °  [address] [place], [place] [address] [phone]  Audiological Evaluation    Name: Virginia Fischer     DOB:   1956/08/24      MRN:   [id number]                                                                                     Service Date: 01/23/2025     Accompanied by: self    Patient comes today after Dr. Greggory, ENT sent a referral for a hearing evaluation due to concerns with tinnitus.   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 Left ear:  clear external ear canal and notable landmarks visualized on the tympanic membrane.  Tympanometry: Right ear: Type A - Normal external ear canal volume with normal middle ear pressure and normal tympanic membrane compliance. Findings are consistent with normal middle ear function. Left ear: Type A - Normal external ear canal volume with normal middle ear pressure and normal tympanic membrane compliance. Findings are consistent with normal middle ear function.  Hearing Evaluation The hearing test results were completed under headphones and results are deemed to be of good reliability. Test technique:  conventional    Pure tone Audiometry: Right ear- normal sloping to moderately-severe  sensorineural hearing loss from 250 Hz - 8000 Hz. Left ear-  normal sloping to moderate sensorineural hearing loss from 250 Hz - 8000 Hz.  Speech Audiometry: Right  ear- Speech Reception Threshold (SRT) was obtained at 25 dBHL. Left ear-Speech Reception Threshold (SRT) was obtained at 25 dBHL.   Word Recognition Score Tested using NU-6 (recorded) Right ear: 100% was obtained at a presentation level of 70 dBHL with contralateral masking which is deemed as  excellent. Left ear: 96% was obtained at a presentation level of 70 dBHL with contralateral masking which is deemed as  excellent.   Impression: There is not a significant difference in pure-tone thresholds between ears. There is not a significant difference in the word recognition score in between ears.    Recommendations: Follow up with ENT as scheduled. Return for a hearing evaluation if concerns with hearing changes arise or per MD recommendation. Recommend going to [facility].   Parneet Glantz MARIE LEROUX-MARTINEZ, AUD   "
